# Patient Record
Sex: Female | Born: 1940 | Race: White | Hispanic: No | Smoking: Never smoker
Health system: Southern US, Community
[De-identification: ages and names within clinical notes are randomized; demographics above are authoritative.]

## PROBLEM LIST (undated history)

## (undated) DIAGNOSIS — I10 Essential (primary) hypertension: Secondary | ICD-10-CM

## (undated) DIAGNOSIS — E785 Hyperlipidemia, unspecified: Secondary | ICD-10-CM

## (undated) DIAGNOSIS — K219 Gastro-esophageal reflux disease without esophagitis: Secondary | ICD-10-CM

## (undated) HISTORY — PX: CHOLECYSTECTOMY: SHX55

## (undated) HISTORY — PX: KNEE ARTHROSCOPY: SHX127

## (undated) HISTORY — PX: APPENDECTOMY: SHX54

## (undated) HISTORY — PX: ABDOMINAL HYSTERECTOMY: SHX81

## (undated) HISTORY — PX: HERNIA REPAIR: SHX51

---

## 2006-06-06 ENCOUNTER — Ambulatory Visit: Payer: Self-pay | Admitting: Gastroenterology

## 2006-06-13 ENCOUNTER — Ambulatory Visit: Payer: Self-pay | Admitting: Gastroenterology

## 2007-01-22 ENCOUNTER — Emergency Department: Payer: Self-pay | Admitting: Emergency Medicine

## 2007-01-24 ENCOUNTER — Other Ambulatory Visit: Payer: Self-pay

## 2007-01-24 ENCOUNTER — Inpatient Hospital Stay: Payer: Self-pay | Admitting: General Surgery

## 2007-08-17 ENCOUNTER — Ambulatory Visit: Payer: Self-pay | Admitting: Internal Medicine

## 2007-12-02 ENCOUNTER — Ambulatory Visit: Payer: Self-pay | Admitting: Internal Medicine

## 2007-12-12 ENCOUNTER — Ambulatory Visit: Payer: Self-pay | Admitting: Internal Medicine

## 2008-01-02 ENCOUNTER — Ambulatory Visit: Payer: Self-pay | Admitting: Internal Medicine

## 2008-02-19 ENCOUNTER — Ambulatory Visit: Payer: Self-pay | Admitting: Internal Medicine

## 2008-03-03 ENCOUNTER — Ambulatory Visit: Payer: Self-pay | Admitting: Internal Medicine

## 2008-04-02 ENCOUNTER — Ambulatory Visit: Payer: Self-pay | Admitting: Internal Medicine

## 2008-04-15 ENCOUNTER — Ambulatory Visit: Payer: Self-pay | Admitting: Internal Medicine

## 2008-05-03 ENCOUNTER — Ambulatory Visit: Payer: Self-pay | Admitting: Internal Medicine

## 2008-06-03 ENCOUNTER — Ambulatory Visit: Payer: Self-pay | Admitting: Internal Medicine

## 2008-06-10 ENCOUNTER — Ambulatory Visit: Payer: Self-pay | Admitting: Internal Medicine

## 2008-07-01 ENCOUNTER — Ambulatory Visit: Payer: Self-pay | Admitting: Internal Medicine

## 2008-08-31 ENCOUNTER — Ambulatory Visit: Payer: Self-pay | Admitting: Internal Medicine

## 2008-09-27 ENCOUNTER — Ambulatory Visit: Payer: Self-pay | Admitting: Internal Medicine

## 2008-10-01 ENCOUNTER — Ambulatory Visit: Payer: Self-pay | Admitting: Internal Medicine

## 2009-01-20 ENCOUNTER — Ambulatory Visit: Payer: Self-pay | Admitting: Internal Medicine

## 2009-01-31 ENCOUNTER — Ambulatory Visit: Payer: Self-pay | Admitting: Internal Medicine

## 2009-04-27 ENCOUNTER — Emergency Department: Payer: Self-pay | Admitting: Emergency Medicine

## 2009-06-03 ENCOUNTER — Ambulatory Visit: Payer: Self-pay | Admitting: Internal Medicine

## 2009-06-09 ENCOUNTER — Ambulatory Visit: Payer: Self-pay | Admitting: Internal Medicine

## 2009-07-01 ENCOUNTER — Ambulatory Visit: Payer: Self-pay | Admitting: Internal Medicine

## 2010-06-05 ENCOUNTER — Ambulatory Visit: Payer: Self-pay | Admitting: Internal Medicine

## 2010-07-02 ENCOUNTER — Ambulatory Visit: Payer: Self-pay | Admitting: Internal Medicine

## 2010-11-23 ENCOUNTER — Ambulatory Visit: Payer: Self-pay | Admitting: Unknown Physician Specialty

## 2011-08-16 ENCOUNTER — Ambulatory Visit: Payer: Self-pay | Admitting: Gastroenterology

## 2011-08-17 LAB — PATHOLOGY REPORT

## 2012-08-12 ENCOUNTER — Emergency Department: Payer: Self-pay | Admitting: Internal Medicine

## 2013-01-02 ENCOUNTER — Ambulatory Visit: Payer: Self-pay

## 2013-04-04 ENCOUNTER — Ambulatory Visit: Payer: Self-pay | Admitting: General Practice

## 2013-04-23 ENCOUNTER — Ambulatory Visit: Payer: Self-pay | Admitting: General Practice

## 2013-04-23 DIAGNOSIS — I10 Essential (primary) hypertension: Secondary | ICD-10-CM

## 2013-04-23 LAB — CBC
MCH: 29.9 pg (ref 26.0–34.0)
MCHC: 33.1 g/dL (ref 32.0–36.0)
MCV: 91 fL (ref 80–100)
Platelet: 145 10*3/uL — ABNORMAL LOW (ref 150–440)
RDW: 13.4 % (ref 11.5–14.5)
WBC: 4.8 10*3/uL (ref 3.6–11.0)

## 2013-04-23 LAB — POTASSIUM: Potassium: 3.6 mmol/L (ref 3.5–5.1)

## 2013-05-02 ENCOUNTER — Ambulatory Visit: Payer: Self-pay | Admitting: General Practice

## 2014-08-24 NOTE — Op Note (Signed)
PATIENT NAME:  Rachael Knapp, Rachael Knapp MR#:  [id number] DATE OF BIRTH:  [birth year]  DATE OF PROCEDURE:  05/02/2013  PREOPERATIVE DIAGNOSIS: Internal derangement of the right knee.   POSTOPERATIVE DIAGNOSES:  1. Tear of the posterior horn of the medial meniscus, right knee.  2. 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  3. 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   PROCEDURES PERFORMED: Right knee arthroscopy, partial medial meniscectomy and chondroplasty of the medial and patellofemoral articulation.   SURGEON: Laurice Record. Holley Bouche., M.D.   ANESTHESIA: General.   ESTIMATED BLOOD LOSS: Minimal.   TOURNIQUET TIME: Not used.   DRAINS: None.   INDICATIONS FOR SURGERY: The patient is a 74 year old female who has been seen for complaints of swelling and right knee pain. MRI demonstrated findings consistent with meniscal pathology. After discussion of the risks and benefits of surgical intervention, the patient expressed understanding of the risks and benefits and agreed with plans for surgical intervention.   PROCEDURE IN DETAIL: The patient was brought into the operating room and, after adequate general anesthesia was achieved, a tourniquet was placed on the patient's right thigh and leg was placed in a leg holder. All bony prominences were well padded. The patient's right knee and leg were cleaned and prepped with alcohol and DuraPrep and draped in the usual sterile fashion. A "timeout" was performed as per usual protocol. The anticipated portal sites were injected with 0.25% Marcaine with epinephrine. An anterolateral portal was created and a cannula was inserted. A large effusion was evacuated. The scope was inserted, and the knee was distended with fluid using the Stryker pump. The scope was advanced down the medial gutter and into the medial compartment of the knee. Under visualization with the scope, an anteromedial portal was created, and a  hook probe was inserted. Inspection of the medial compartment demonstrated an area of grade 3 chondromalacia on the more posterior aspect of the medial femoral condyle. This area was debrided and contoured using the ArthroCare wand. Inspection of the meniscus demonstrated a degenerative tear of the posterior horn of the medial meniscus. This was debrided using meniscal punches and a 4.5 mm shaver. Final contouring was performed using the ArthroCare wand. The anterior horn was visualized and probed and felt to be stable. The scope was then advanced into the intracondylar region. The anterior cruciate ligament was visualized and probed and felt to be stable. The scope was removed from the anterolateral portal and reinserted via the anteromedial portal so as to better visualize the lateral compartment. The articular surface was in good condition. Some mild fraying of the inner margin of the meniscus was noted, and this was debrided using the ArthroCare wand. The remaining portion of the meniscus was intact. Finally, the scope was positioned so as to visualize the patellofemoral articulation. Good patellar tracking was noted. Grade 3 to early grade 4 changes of chondromalacia were noted to the lateral facet. Some contouring was performed.   The knee was irrigated with copious amounts of fluid and then suctioned dry. The anterolateral portal was reapproximated using #3-0 nylon. A combination of 0.25% Marcaine with epinephrine and 4 mg of morphine was injected via the scope. The scope was removed, and the anteromedial portal was reapproximated using #3-0 nylon. Sterile dressing was applied, followed by application of an ice wrap.   The patient tolerated the procedure well. She was transported to the recovery room in stable condition.   ____________________________ Laurice Record. Hooten Jr.,  MD jph:gb D: 05/03/2013 01:28:10 ET T: 05/03/2013 01:52:27 ET JOB#: [id number]  cc: Jeneen Rinks P. Holley Bouche., MD, <Dictator> Laurice Record  Holley Bouche MD ELECTRONICALLY SIGNED 05/06/2013 15:58

## 2014-12-24 ENCOUNTER — Emergency Department: Payer: PPO

## 2014-12-24 ENCOUNTER — Emergency Department
Admission: EM | Admit: 2014-12-24 | Discharge: 2014-12-24 | Disposition: A | Discharge status: Home / Self Care | Payer: PPO | Attending: Emergency Medicine | Admitting: Emergency Medicine

## 2014-12-24 ENCOUNTER — Encounter: Payer: Self-pay | Admitting: *Deleted

## 2014-12-24 DIAGNOSIS — W19XXXA Unspecified fall, initial encounter: Secondary | ICD-10-CM

## 2014-12-24 DIAGNOSIS — S0031XA Abrasion of nose, initial encounter: Secondary | ICD-10-CM | POA: Diagnosis not present

## 2014-12-24 DIAGNOSIS — Y9389 Activity, other specified: Secondary | ICD-10-CM | POA: Diagnosis not present

## 2014-12-24 DIAGNOSIS — Y9289 Other specified places as the place of occurrence of the external cause: Secondary | ICD-10-CM | POA: Insufficient documentation

## 2014-12-24 DIAGNOSIS — I10 Essential (primary) hypertension: Secondary | ICD-10-CM | POA: Insufficient documentation

## 2014-12-24 DIAGNOSIS — W01198A Fall on same level from slipping, tripping and stumbling with subsequent striking against other object, initial encounter: Secondary | ICD-10-CM | POA: Insufficient documentation

## 2014-12-24 DIAGNOSIS — Y998 Other external cause status: Secondary | ICD-10-CM | POA: Diagnosis not present

## 2014-12-24 DIAGNOSIS — T148XXA Other injury of unspecified body region, initial encounter: Secondary | ICD-10-CM

## 2014-12-24 DIAGNOSIS — Z880 Allergy status to penicillin: Secondary | ICD-10-CM | POA: Insufficient documentation

## 2014-12-24 DIAGNOSIS — S0992XA Unspecified injury of nose, initial encounter: Secondary | ICD-10-CM | POA: Diagnosis present

## 2014-12-24 HISTORY — DX: Gastro-esophageal reflux disease without esophagitis: K21.9

## 2014-12-24 HISTORY — DX: Essential (primary) hypertension: I10

## 2014-12-24 HISTORY — DX: Hyperlipidemia, unspecified: E78.5

## 2014-12-24 NOTE — ED Notes (Signed)
States she hit her toe and fell face first..

## 2014-12-24 NOTE — ED Provider Notes (Signed)
[facility] Emergency Department Provider Note   ____________________________________________  Time seen: 1455  I have reviewed the triage vital signs and the nursing notes.   HISTORY  Chief Complaint Fall   History limited by: Not Limited   HPI Rachael Knapp is a 74 y.o. female who presents to the emergency department today after a fall. The patient states she was walking up a step when she caught her big toe. Hit the front of her face. She denies any LOC. States she feels like she jarred her brain. She denies any neck pain. She denies any chest pain, shortness of breath, recent fevers.    Past Medical History  Diagnosis Date  . Hypertension   . Hyperlipemia   . GERD (gastroesophageal reflux disease)     There are no active problems to display for this patient.   Past Surgical History  Procedure Laterality Date  . Abdominal hysterectomy      No current outpatient prescriptions on file.  Allergies Amoxicillin and Clindamycin/lincomycin  History reviewed. No pertinent family history.  Social History Social History  Substance Use Topics  . Smoking status: Never Smoker   . Smokeless tobacco: None  . Alcohol Use: None    Review of Systems  Constitutional: Negative for fever. Cardiovascular: Negative for chest pain. Respiratory: Negative for shortness of breath. Gastrointestinal: Negative for abdominal pain, vomiting and diarrhea. Genitourinary: Negative for dysuria. Musculoskeletal: Negative for back pain. Skin: Abrasion to the nose Neurological: Negative for headaches, focal weakness or numbness.  10-point ROS otherwise negative.  ____________________________________________   PHYSICAL EXAM:  VITAL SIGNS: ED Triage Vitals  Enc Vitals Group     BP 12/24/14 1430 165/67 mmHg     Pulse Rate 12/24/14 1430 74     Resp 12/24/14 1430 18     Temp 12/24/14 1430 98.5 F (36.9 C)     Temp Source 12/24/14 1430 Oral     SpO2  12/24/14 1430 99 %     Weight 12/24/14 1430 160 lb (72.576 kg)     Height 12/24/14 1430 5\' 7"  (1.702 m)   Constitutional: Alert and oriented. Well appearing and in no distress. Eyes: Conjunctivae are normal. PERRL. Normal extraocular movements. ENT   Head: Normocephalic. Abrasion over nose. No hematympanum   Nose: No congestion/rhinnorhea. No septal hematoma.   Mouth/Throat: Mucous membranes are moist.   Neck: No stridor. No midline tenderness.  Hematological/Lymphatic/Immunilogical: No cervical lymphadenopathy. Cardiovascular: Normal rate, regular rhythm.  No murmurs, rubs, or gallops. Respiratory: Normal respiratory effort without tachypnea nor retractions. Breath sounds are clear and equal bilaterally. No wheezes/rales/rhonchi. Gastrointestinal: Soft and nontender. No distention.  Genitourinary: Deferred Musculoskeletal: Normal range of motion in all extremities. No joint effusions.  No lower extremity tenderness nor edema. Pelvis stable. Neurologic:  Normal speech and language. No gross focal neurologic deficits are appreciated. Speech is normal.  Skin:  Skin is warm, dry and intact. No rash noted. Psychiatric: Mood and affect are normal. Speech and behavior are normal. Patient exhibits appropriate insight and judgment.  ____________________________________________    LABS (pertinent positives/negatives)  None  ____________________________________________   EKG  None  ____________________________________________    RADIOLOGY  CT head  IMPRESSION: No acute intracranial abnormality.  Small calcified meningioma.  ____________________________________________   PROCEDURES  Procedure(s) performed: None  Critical Care performed: No  ____________________________________________   INITIAL IMPRESSION / ASSESSMENT AND PLAN / ED COURSE  Pertinent labs & imaging results that were available during my care of the patient were reviewed  by me and considered  in my medical decision making (see chart for details).  Patient presented to the emergency department because of concerns for facial trauma after a fall. Patient had a mechanical fall. Did suffer some abrasions to her nose. No septal hematoma noted. Patient's head CT was negative for any cranial bleed. Neck without any midline tenderness, nontender to full range of motion.  ____________________________________________   FINAL CLINICAL IMPRESSION(S) / ED DIAGNOSES  Final diagnoses:  Fall, initial encounter  Abrasion     Nance Pear, MD 12/24/14 [phone]

## 2014-12-24 NOTE — Discharge Instructions (Signed)
Please seek medical attention for any high fevers, chest pain, shortness of breath, change in behavior, persistent vomiting, bloody stool or any other new or concerning symptoms.   Abrasion An abrasion is a cut or scrape of the skin. Abrasions do not extend through all layers of the skin and most heal within 10 days. It is important to care for your abrasion properly to prevent infection. CAUSES  Most abrasions are caused by falling on, or gliding across, the ground or other surface. When your skin rubs on something, the outer and inner layer of skin rubs off, causing an abrasion. DIAGNOSIS  Your caregiver will be able to diagnose an abrasion during a physical exam.  TREATMENT  Your treatment depends on how large and deep the abrasion is. Generally, your abrasion will be cleaned with water and a mild soap to remove any dirt or debris. An antibiotic ointment may be put over the abrasion to prevent an infection. A bandage (dressing) may be wrapped around the abrasion to keep it from getting dirty.  You may need a tetanus shot if:  You cannot remember when you had your last tetanus shot.  You have never had a tetanus shot.  The injury broke your skin. If you get a tetanus shot, your arm may swell, get red, and feel warm to the touch. This is common and not a problem. If you need a tetanus shot and you choose not to have one, there is a rare chance of getting tetanus. Sickness from tetanus can be serious.  HOME CARE INSTRUCTIONS   If a dressing was applied, change it at least once a day or as directed by your caregiver. If the bandage sticks, soak it off with warm water.   Wash the area with water and a mild soap to remove all the ointment 2 times a day. Rinse off the soap and pat the area dry with a clean towel.   Reapply any ointment as directed by your caregiver. This will help prevent infection and keep the bandage from sticking. Use gauze over the wound and under the dressing to help keep  the bandage from sticking.   Change your dressing right away if it becomes wet or dirty.   Only take over-the-counter or prescription medicines for pain, discomfort, or fever as directed by your caregiver.   Follow up with your caregiver within 24-48 hours for a wound check, or as directed. If you were not given a wound-check appointment, look closely at your abrasion for redness, swelling, or pus. These are signs of infection. SEEK IMMEDIATE MEDICAL CARE IF:   You have increasing pain in the wound.   You have redness, swelling, or tenderness around the wound.   You have pus coming from the wound.   You have a fever or persistent symptoms for more than 2-3 days.  You have a fever and your symptoms suddenly get worse.  You have a bad smell coming from the wound or dressing.  MAKE SURE YOU:   Understand these instructions.  Will watch your condition.  Will get help right away if you are not doing well or get worse. Document Released: 01/27/2005 Document Revised: 04/05/2012 Document Reviewed: 03/23/2011 [facility] Patient Information 2015 [place], [place]. This information is not intended to replace advice given to you by your health care provider. Make sure you discuss any questions you have with your health care provider.

## 2015-03-19 ENCOUNTER — Other Ambulatory Visit: Payer: Self-pay | Admitting: Nurse Practitioner

## 2015-03-19 DIAGNOSIS — E2839 Other primary ovarian failure: Secondary | ICD-10-CM

## 2015-04-02 ENCOUNTER — Ambulatory Visit: Payer: PPO

## 2015-04-09 ENCOUNTER — Ambulatory Visit
Admission: RE | Admit: 2015-04-09 | Discharge: 2015-04-09 | Disposition: A | Discharge status: Home / Self Care | Payer: PPO | Source: Ambulatory Visit | Attending: Nurse Practitioner | Admitting: Nurse Practitioner

## 2015-04-09 DIAGNOSIS — E2839 Other primary ovarian failure: Secondary | ICD-10-CM | POA: Insufficient documentation

## 2015-06-18 DIAGNOSIS — E782 Mixed hyperlipidemia: Secondary | ICD-10-CM | POA: Diagnosis not present

## 2015-06-18 DIAGNOSIS — I10 Essential (primary) hypertension: Secondary | ICD-10-CM | POA: Diagnosis not present

## 2015-06-18 DIAGNOSIS — K219 Gastro-esophageal reflux disease without esophagitis: Secondary | ICD-10-CM | POA: Diagnosis not present

## 2016-01-13 ENCOUNTER — Other Ambulatory Visit: Payer: Self-pay | Admitting: Nurse Practitioner

## 2016-01-13 DIAGNOSIS — I10 Essential (primary) hypertension: Secondary | ICD-10-CM | POA: Diagnosis not present

## 2016-01-13 DIAGNOSIS — Z1231 Encounter for screening mammogram for malignant neoplasm of breast: Secondary | ICD-10-CM

## 2016-01-13 DIAGNOSIS — E782 Mixed hyperlipidemia: Secondary | ICD-10-CM | POA: Diagnosis not present

## 2016-01-13 DIAGNOSIS — K219 Gastro-esophageal reflux disease without esophagitis: Secondary | ICD-10-CM | POA: Diagnosis not present

## 2016-01-13 DIAGNOSIS — Z0001 Encounter for general adult medical examination with abnormal findings: Secondary | ICD-10-CM | POA: Diagnosis not present

## 2016-01-16 DIAGNOSIS — E782 Mixed hyperlipidemia: Secondary | ICD-10-CM | POA: Diagnosis not present

## 2016-01-16 DIAGNOSIS — E559 Vitamin D deficiency, unspecified: Secondary | ICD-10-CM | POA: Diagnosis not present

## 2016-01-16 DIAGNOSIS — Z0001 Encounter for general adult medical examination with abnormal findings: Secondary | ICD-10-CM | POA: Diagnosis not present

## 2016-01-16 DIAGNOSIS — I10 Essential (primary) hypertension: Secondary | ICD-10-CM | POA: Diagnosis not present

## 2016-01-28 ENCOUNTER — Emergency Department
Admission: EM | Admit: 2016-01-28 | Discharge: 2016-01-29 | Disposition: A | Discharge status: Home / Self Care | Payer: PPO | Attending: Emergency Medicine | Admitting: Emergency Medicine

## 2016-01-28 ENCOUNTER — Encounter: Payer: Self-pay | Admitting: Emergency Medicine

## 2016-01-28 DIAGNOSIS — I10 Essential (primary) hypertension: Secondary | ICD-10-CM | POA: Insufficient documentation

## 2016-01-28 DIAGNOSIS — Y9389 Activity, other specified: Secondary | ICD-10-CM | POA: Insufficient documentation

## 2016-01-28 DIAGNOSIS — T63441A Toxic effect of venom of bees, accidental (unintentional), initial encounter: Secondary | ICD-10-CM | POA: Diagnosis not present

## 2016-01-28 DIAGNOSIS — T63481A Toxic effect of venom of other arthropod, accidental (unintentional), initial encounter: Secondary | ICD-10-CM | POA: Diagnosis not present

## 2016-01-28 DIAGNOSIS — L03113 Cellulitis of right upper limb: Secondary | ICD-10-CM | POA: Insufficient documentation

## 2016-01-28 DIAGNOSIS — X58XXXA Exposure to other specified factors, initial encounter: Secondary | ICD-10-CM | POA: Diagnosis not present

## 2016-01-28 DIAGNOSIS — Y92007 Garden or yard of unspecified non-institutional (private) residence as the place of occurrence of the external cause: Secondary | ICD-10-CM | POA: Diagnosis not present

## 2016-01-28 DIAGNOSIS — Y999 Unspecified external cause status: Secondary | ICD-10-CM | POA: Insufficient documentation

## 2016-01-28 DIAGNOSIS — S50861A Insect bite (nonvenomous) of right forearm, initial encounter: Secondary | ICD-10-CM | POA: Diagnosis not present

## 2016-01-28 DIAGNOSIS — T7840XA Allergy, unspecified, initial encounter: Secondary | ICD-10-CM | POA: Diagnosis not present

## 2016-01-28 LAB — CBC WITH DIFFERENTIAL/PLATELET
Basophils Absolute: 0 10*3/uL (ref 0–0.1)
Basophils Relative: 1 %
EOS ABS: 0.1 10*3/uL (ref 0–0.7)
Eosinophils Relative: 2 %
HEMATOCRIT: 40.5 % (ref 35.0–47.0)
HEMOGLOBIN: 14 g/dL (ref 12.0–16.0)
LYMPHS ABS: 2 10*3/uL (ref 1.0–3.6)
LYMPHS PCT: 26 %
MCH: 30.9 pg (ref 26.0–34.0)
MCHC: 34.5 g/dL (ref 32.0–36.0)
MCV: 89.5 fL (ref 80.0–100.0)
Monocytes Absolute: 0.6 10*3/uL (ref 0.2–0.9)
Monocytes Relative: 7 %
NEUTROS ABS: 4.9 10*3/uL (ref 1.4–6.5)
NEUTROS PCT: 64 %
Platelets: 122 10*3/uL — ABNORMAL LOW (ref 150–440)
RBC: 4.53 MIL/uL (ref 3.80–5.20)
RDW: 13.3 % (ref 11.5–14.5)
WBC: 7.6 10*3/uL (ref 3.6–11.0)

## 2016-01-28 MED ORDER — SULFAMETHOXAZOLE-TRIMETHOPRIM 800-160 MG PO TABS
1.0000 | ORAL_TABLET | Freq: Once | ORAL | Status: AC
  Administered 2016-01-28: 1 via ORAL
  Filled 2016-01-28: qty 1

## 2016-01-28 MED ORDER — FAMOTIDINE 20 MG PO TABS
40.0000 mg | ORAL_TABLET | Freq: Once | ORAL | Status: AC
  Administered 2016-01-28: 40 mg via ORAL
  Filled 2016-01-28: qty 2

## 2016-01-28 MED ORDER — DIPHENHYDRAMINE HCL 50 MG/ML IJ SOLN
25.0000 mg | Freq: Once | INTRAMUSCULAR | Status: AC
  Administered 2016-01-28: 25 mg via INTRAMUSCULAR
  Filled 2016-01-28: qty 1

## 2016-01-28 NOTE — ED Triage Notes (Signed)
Pt ambulatory to triage in NAD, reports yesterday thought she had bug bite to right forearm, but swelling has increased. Large red, swollen area noted 19x13cm.  Pt reports some intermittent itching.  Pt tx at home with allegra cream and ice at home w/o relief.

## 2016-01-28 NOTE — ED Notes (Signed)
Pt has insect bite to right forearm with redness and swelling that began yesterday and got worse tonight.  No itching now.  Pt took allegra with some relief.  Pt also using icepack for swelling.

## 2016-01-28 NOTE — ED Notes (Signed)
Pt was working on the garden yesterday when felt like something pricked her right forearm. Today has large swollen, red area to forearm, unsure of insect bite.

## 2016-01-29 LAB — BASIC METABOLIC PANEL
ANION GAP: 7 (ref 5–15)
BUN: 14 mg/dL (ref 6–20)
CHLORIDE: 103 mmol/L (ref 101–111)
CO2: 27 mmol/L (ref 22–32)
Calcium: 9.2 mg/dL (ref 8.9–10.3)
Creatinine, Ser: 1.11 mg/dL — ABNORMAL HIGH (ref 0.44–1.00)
GFR calc Af Amer: 55 mL/min — ABNORMAL LOW (ref >60)
GFR, EST NON AFRICAN AMERICAN: 47 mL/min — AB (ref >60)
Glucose, Bld: 138 mg/dL — ABNORMAL HIGH (ref 65–99)
POTASSIUM: 3.7 mmol/L (ref 3.5–5.1)
SODIUM: 137 mmol/L (ref 135–145)

## 2016-01-29 MED ORDER — RANITIDINE HCL 150 MG PO TABS: 150.0000 mg | ORAL_TABLET | Freq: Two times a day (BID) | ORAL | 0 refills | Status: DC

## 2016-01-29 MED ORDER — SULFAMETHOXAZOLE-TRIMETHOPRIM 800-160 MG PO TABS: 1.0000 | ORAL_TABLET | Freq: Two times a day (BID) | ORAL | 0 refills | Status: DC

## 2016-01-29 NOTE — Discharge Instructions (Signed)
Your labs are normal. You are being treated for a likely allergic reaction to an insect bite. You are also being covered for any potential skin infection with an antibiotic. Take the antibiotic as directed for skin infection. Take OTC Benadryl as directed for itch relief. Follow-up with Dr. Humphrey Rolls for recheck of your arm cellulitis in 2-3 days or return to the ED if needed for spreading or redness, swelling, or fevers.

## 2016-01-29 NOTE — ED Provider Notes (Signed)
[facility] Emergency Department Provider Note ____________________________________________  Time seen: 2327  I have reviewed the triage vital signs and the nursing notes.  HISTORY  Chief Complaint  Arm Swelling  HPI Rachael Knapp is a 75 y.o. female presents to the ED accompanied by her husband for evaluation of presumed allergic reaction to the right forearm. Patient describes yesterday she was working with her husband in the yard cutting grass from the edge of the fence. She describes at some point looking at her forearm tenderness a single needle prick type lesion to the volar forearm. She had a single spot of blood that was not initially with any bite, standing, or prick from a thorn. She had mild swelling and itching to the area surrounding the puncture yesterday while at work. She presents today with significant increase to the area of redness, swelling, and warmth. She reports only mild, intermittent itching in the time. She has applied and topical antihistamine cream and ice compresses to the area without significant relief. She is not taking any antihistamines in the interim. She denies any interim fevers, chills, sweats. She has also been no spontaneous drainage from the area. She denies any known contact, Allergan, or irritant exposure.  Past Medical History:  Diagnosis Date  . GERD (gastroesophageal reflux disease)   . Hyperlipemia   . Hypertension     There are no active problems to display for this patient.   Past Surgical History:  Procedure Laterality Date  . ABDOMINAL HYSTERECTOMY      Prior to Admission medications   Medication Sig Start Date End Date Taking? Authorizing Provider  ranitidine (ZANTAC) 150 MG tablet Take 1 tablet (150 mg total) by mouth 2 (two) times daily. 01/29/16   Vashti Bolanos V Bacon Darcey Cardy, PA-C  sulfamethoxazole-trimethoprim (BACTRIM DS,SEPTRA DS) 800-160 MG tablet Take 1 tablet by mouth 2 (two) times daily. 01/29/16   Jakye Mullens  V Bacon Karelyn Brisby, PA-C    Allergies Amoxicillin; Ranitidine; and Clindamycin/lincomycin  History reviewed. No pertinent family history.  Social History Social History  Substance Use Topics  . Smoking status: Never Smoker  . Smokeless tobacco: Never Used  . Alcohol use No   Review of Systems  Constitutional: Negative for fever. Eyes: Negative for visual changes. ENT: Negative for sore throat. Cardiovascular: Negative for chest pain. Respiratory: Negative for shortness of breath. Gastrointestinal: Negative for abdominal pain, vomiting and diarrhea. Skin: Positive for rash. Neurological: Negative for headaches, focal weakness or numbness. ____________________________________________  PHYSICAL EXAM:  VITAL SIGNS: ED Triage Vitals [01/28/16 2200]  Enc Vitals Group     BP (!) 182/71     Pulse Rate 72     Resp 16     Temp 98.5 F (36.9 C)     Temp Source Oral     SpO2 100 %     Weight 160 lb (72.6 kg)     Height 5\' 5"  (1.651 m)     Head Circumference      Peak Flow      Pain Score 0     Pain Loc      Pain Edu?      Excl. in [place]?     Constitutional: Alert and oriented. Well appearing and in no distress. Head: Normocephalic and atraumatic. Cardiovascular: Normal rate, regular rhythm. Normal distal pulses and capillary refill. Respiratory: Normal respiratory effort. No wheezes/rales/rhonchi. Musculoskeletal: Nontender with normal range of motion in all extremities.  Neurologic:  Normal gait without ataxia. Normal speech and language. No gross  focal neurologic deficits are appreciated. Skin:  Skin is warm, dry and intact. Patient's volar right forearm from the wrist to the proximal elbow noted to have a erythematous, indurated, warm macular lesion that is easily blanchable. The area measures 19 x 13 cm that is large point. There appears to be a distal lesion with local papillae which may represent the initial insect bite. There is no pointing lesion, punctum, or fluctuance  appreciated. Psychiatric: Mood and affect are normal. Patient exhibits appropriate insight and judgment. ____________________________________________    LABS (pertinent positives/negatives) Labs Reviewed  CBC WITH DIFFERENTIAL/PLATELET - Abnormal; Notable for the following:       Result Value   Platelets 122 (*)    All other components within normal limits  BASIC METABOLIC PANEL - Abnormal; Notable for the following:    Glucose, Bld 138 (*)    Creatinine, Ser 1.11 (*)    GFR calc non Af Amer 47 (*)    GFR calc Af Amer 55 (*)    All other components within normal limits   ___________________________________________  PROCEDURES  Benadryl 25 mg IM Famotidine 40 mg PO Bactrim DS 1 PO ____________________________________________  INITIAL IMPRESSION / ASSESSMENT AND PLAN / ED COURSE  Patient with a likely local allergic reaction to insect bite/sting with a possible superficial cellulitis. She is discharged following the initiation of the. Treatment in the ED. She'll be discharged with a prescription for Bactrim DS as well has instructions to dose over-the-counter Benadryl for antihistamine blockade. Her skin is marked at the border of her erythematous lesion. She will apply cool compresses and follow with her primary care provider for interim evaluation. Return precautions were reviewed.  Clinical Course   ____________________________________________  FINAL CLINICAL IMPRESSION(S) / ED DIAGNOSES  Final diagnoses:  Allergic reaction to insect sting, accidental or unintentional, initial encounter  Cellulitis of right upper extremity      Melvenia Needles, PA-C 01/29/16 0043    Harvest Dark, MD 01/29/16 2259

## 2016-02-02 ENCOUNTER — Ambulatory Visit
Admission: RE | Admit: 2016-02-02 | Discharge: 2016-02-02 | Disposition: A | Discharge status: Home / Self Care | Payer: PPO | Source: Ambulatory Visit | Attending: Nurse Practitioner | Admitting: Nurse Practitioner

## 2016-02-02 DIAGNOSIS — Z1231 Encounter for screening mammogram for malignant neoplasm of breast: Secondary | ICD-10-CM | POA: Diagnosis not present

## 2016-02-02 MED ORDER — DIAZEPAM 5 MG PO TABS
ORAL_TABLET | ORAL | Status: AC
  Filled 2016-02-02: qty 1

## 2016-02-03 DIAGNOSIS — D696 Thrombocytopenia, unspecified: Secondary | ICD-10-CM | POA: Diagnosis not present

## 2016-02-03 DIAGNOSIS — R944 Abnormal results of kidney function studies: Secondary | ICD-10-CM | POA: Diagnosis not present

## 2016-02-03 DIAGNOSIS — L03113 Cellulitis of right upper limb: Secondary | ICD-10-CM | POA: Diagnosis not present

## 2016-02-12 DIAGNOSIS — L03113 Cellulitis of right upper limb: Secondary | ICD-10-CM | POA: Diagnosis not present

## 2016-02-12 DIAGNOSIS — R944 Abnormal results of kidney function studies: Secondary | ICD-10-CM | POA: Diagnosis not present

## 2016-02-12 DIAGNOSIS — D696 Thrombocytopenia, unspecified: Secondary | ICD-10-CM | POA: Diagnosis not present

## 2016-02-17 ENCOUNTER — Other Ambulatory Visit: Payer: Self-pay | Admitting: *Deleted

## 2016-02-17 ENCOUNTER — Inpatient Hospital Stay
Admission: RE | Admit: 2016-02-17 | Discharge: 2016-02-17 | Disposition: A | Discharge status: Home / Self Care | Payer: Self-pay | Source: Ambulatory Visit | Attending: *Deleted | Admitting: *Deleted

## 2016-02-17 DIAGNOSIS — Z9289 Personal history of other medical treatment: Secondary | ICD-10-CM

## 2016-07-19 DIAGNOSIS — I10 Essential (primary) hypertension: Secondary | ICD-10-CM | POA: Diagnosis not present

## 2016-07-19 DIAGNOSIS — E782 Mixed hyperlipidemia: Secondary | ICD-10-CM | POA: Diagnosis not present

## 2016-07-19 DIAGNOSIS — K219 Gastro-esophageal reflux disease without esophagitis: Secondary | ICD-10-CM | POA: Diagnosis not present

## 2017-01-17 DIAGNOSIS — K219 Gastro-esophageal reflux disease without esophagitis: Secondary | ICD-10-CM | POA: Diagnosis not present

## 2017-01-17 DIAGNOSIS — Z0001 Encounter for general adult medical examination with abnormal findings: Secondary | ICD-10-CM | POA: Diagnosis not present

## 2017-01-17 DIAGNOSIS — R21 Rash and other nonspecific skin eruption: Secondary | ICD-10-CM | POA: Diagnosis not present

## 2017-01-17 DIAGNOSIS — E782 Mixed hyperlipidemia: Secondary | ICD-10-CM | POA: Diagnosis not present

## 2017-01-17 DIAGNOSIS — I10 Essential (primary) hypertension: Secondary | ICD-10-CM | POA: Diagnosis not present

## 2017-01-21 ENCOUNTER — Other Ambulatory Visit: Payer: Self-pay | Admitting: Nurse Practitioner

## 2017-01-21 DIAGNOSIS — Z1231 Encounter for screening mammogram for malignant neoplasm of breast: Secondary | ICD-10-CM

## 2017-01-25 DIAGNOSIS — I10 Essential (primary) hypertension: Secondary | ICD-10-CM | POA: Diagnosis not present

## 2017-01-25 DIAGNOSIS — Z0001 Encounter for general adult medical examination with abnormal findings: Secondary | ICD-10-CM | POA: Diagnosis not present

## 2017-01-25 DIAGNOSIS — D696 Thrombocytopenia, unspecified: Secondary | ICD-10-CM | POA: Diagnosis not present

## 2017-02-09 ENCOUNTER — Ambulatory Visit
Admission: RE | Admit: 2017-02-09 | Discharge: 2017-02-09 | Disposition: A | Discharge status: Home / Self Care | Payer: PPO | Source: Ambulatory Visit | Attending: Nurse Practitioner | Admitting: Nurse Practitioner

## 2017-02-09 DIAGNOSIS — Z1231 Encounter for screening mammogram for malignant neoplasm of breast: Secondary | ICD-10-CM | POA: Diagnosis not present

## 2017-05-16 ENCOUNTER — Other Ambulatory Visit: Payer: Self-pay

## 2017-05-16 MED ORDER — LOSARTAN POTASSIUM-HCTZ 100-12.5 MG PO TABS: 1.0000 | ORAL_TABLET | Freq: Every day | ORAL | 0 refills | Status: DC

## 2017-06-14 DIAGNOSIS — Z862 Personal history of diseases of the blood and blood-forming organs and certain disorders involving the immune mechanism: Secondary | ICD-10-CM | POA: Diagnosis not present

## 2017-06-14 DIAGNOSIS — Z8371 Family history of colonic polyps: Secondary | ICD-10-CM | POA: Diagnosis not present

## 2017-06-23 DIAGNOSIS — M25511 Pain in right shoulder: Secondary | ICD-10-CM | POA: Diagnosis not present

## 2017-07-14 ENCOUNTER — Encounter: Payer: Self-pay | Admitting: Nurse Practitioner

## 2017-07-14 ENCOUNTER — Ambulatory Visit (INDEPENDENT_AMBULATORY_CARE_PROVIDER_SITE_OTHER): Payer: PPO | Admitting: Nurse Practitioner

## 2017-07-14 VITALS — BP 160/90 | HR 97 | Resp 16 | Ht 66.0 in | Wt 150.0 lb

## 2017-07-14 DIAGNOSIS — E785 Hyperlipidemia, unspecified: Secondary | ICD-10-CM | POA: Insufficient documentation

## 2017-07-14 DIAGNOSIS — E782 Mixed hyperlipidemia: Secondary | ICD-10-CM | POA: Diagnosis not present

## 2017-07-14 DIAGNOSIS — I10 Essential (primary) hypertension: Secondary | ICD-10-CM | POA: Diagnosis not present

## 2017-07-14 DIAGNOSIS — K219 Gastro-esophageal reflux disease without esophagitis: Secondary | ICD-10-CM | POA: Insufficient documentation

## 2017-07-14 MED ORDER — LOSARTAN POTASSIUM-HCTZ 100-25 MG PO TABS: 1.0000 | ORAL_TABLET | Freq: Every day | ORAL | 1 refills | Status: DC

## 2017-07-14 NOTE — Progress Notes (Signed)
[facility] [place], [place] [address]  Internal MEDICINE  Office Visit Note  Patient Name: Rachael Knapp  [id number]  [id number]  Date of Service: 07/14/2017  Chief Complaint  Patient presents with  . Hypertension    Hypertension  This is a chronic problem. The current episode started more than 1 year ago. The problem is unchanged. The problem is controlled. Pertinent negatives include no chest pain, headaches, neck pain, palpitations or shortness of breath. There are no associated agents to hypertension. Risk factors for coronary artery disease include dyslipidemia, family history and post-menopausal state. Past treatments include angiotensin blockers and diuretics. The current treatment provides moderate improvement. There are no compliance problems.     Pt is here for routine follow up.    Current Medication: Outpatient Encounter Medications as of 07/14/2017  Medication Sig  . simvastatin (ZOCOR) 20 MG tablet Take by mouth.  . [DISCONTINUED] losartan-hydrochlorothiazide (HYZAAR) 100-12.5 MG tablet Take 1 tablet by mouth daily.  Marland Kitchen losartan-hydrochlorothiazide (HYZAAR) 100-25 MG tablet Take 1 tablet by mouth daily.  . [DISCONTINUED] ranitidine (ZANTAC) 150 MG tablet Take 1 tablet (150 mg total) by mouth 2 (two) times daily. (Patient not taking: Reported on 07/14/2017)  . [DISCONTINUED] sulfamethoxazole-trimethoprim (BACTRIM DS,SEPTRA DS) 800-160 MG tablet Take 1 tablet by mouth 2 (two) times daily.   No facility-administered encounter medications on file as of 07/14/2017.     Surgical History: Past Surgical History:  Procedure Laterality Date  . ABDOMINAL HYSTERECTOMY      Medical History: Past Medical History:  Diagnosis Date  . GERD (gastroesophageal reflux disease)   . Hyperlipemia   . Hypertension     Family History: Family History  Problem Relation Age of Onset  . Breast cancer Neg Hx     Social History    Socioeconomic History  . Marital status: Widowed    Spouse name: Not on file  . Number of children: Not on file  . Years of education: Not on file  . Highest education level: Not on file  Social Needs  . Financial resource strain: Not on file  . Food insecurity - worry: Not on file  . Food insecurity - inability: Not on file  . Transportation needs - medical: Not on file  . Transportation needs - non-medical: Not on file  Occupational History  . Not on file  Tobacco Use  . Smoking status: Never Smoker  . Smokeless tobacco: Never Used  Substance and Sexual Activity  . Alcohol use: No  . Drug use: Not on file  . Sexual activity: Not on file  Other Topics Concern  . Not on file  Social History Narrative  . Not on file      Review of Systems  Constitutional: Negative for activity change, chills, fatigue and unexpected weight change.  HENT: Negative for postnasal drip, rhinorrhea, sneezing and sore throat.   Eyes: Negative for redness.  Respiratory: Negative for cough, chest tightness and shortness of breath.   Cardiovascular: Negative for chest pain and palpitations.       Blood pressure elevated.   Gastrointestinal: Negative for abdominal pain, constipation, diarrhea, nausea and vomiting.  Endocrine: Negative for cold intolerance, heat intolerance, polydipsia, polyphagia and polyuria.  Genitourinary: Negative.  Negative for dysuria and frequency.  Musculoskeletal: Positive for arthralgias. Negative for back pain, joint swelling and neck pain.       Did have some right shoulder and arm pain for short time. Did see orthopedic provider. Was given  anti-inflammatory medication. She got full relief from the pain.   Skin: Negative for rash.  Allergic/Immunologic: Negative for environmental allergies.  Neurological: Negative for tremors, numbness and headaches.  Hematological: Negative for adenopathy. Does not bruise/bleed easily.  Psychiatric/Behavioral: Negative for behavioral  problems (Depression), sleep disturbance and suicidal ideas. The patient is not nervous/anxious.        The patient states that her husband passed away in Jan 17, 2023. Had very short illness. Diagnosed with lung cancer, which and metastasized to his brain. Did not tolerate chemotherapy.     Today's Vitals   07/14/17 1416  BP: (!) 160/90  Pulse: 97  Resp: 16  SpO2: 97%  Weight: 150 lb (68 kg)  Height: 5\' 6"  (1.676 m)    Physical Exam  Constitutional: She is oriented to person, place, and time. She appears well-developed and well-nourished. No distress.  HENT:  Head: Normocephalic and atraumatic.  Mouth/Throat: Oropharynx is clear and moist. No oropharyngeal exudate.  Eyes: EOM are normal. Pupils are equal, round, and reactive to light.  Neck: Normal range of motion. Neck supple. No JVD present. No tracheal deviation present. No thyromegaly present.  Cardiovascular: Normal rate, regular rhythm and normal heart sounds. Exam reveals no gallop and no friction rub.  No murmur heard. Pulmonary/Chest: Effort normal and breath sounds normal. No respiratory distress. She has no wheezes. She has no rales. She exhibits no tenderness.  Abdominal: Soft. Bowel sounds are normal. There is no tenderness.  Musculoskeletal: Normal range of motion.  Lymphadenopathy:    She has no cervical adenopathy.  Neurological: She is alert and oriented to person, place, and time. No cranial nerve deficit.  Skin: Skin is warm and dry. She is not diaphoretic.  Psychiatric: She has a normal mood and affect. Her behavior is normal. Judgment and thought content normal.  Nursing note and vitals reviewed.   Assessment/Plan: 1. Essential hypertension Increased losartan/HCYZ to 100/25mg  daily.  - losartan-hydrochlorothiazide (HYZAAR) 100-25 MG tablet; Take 1 tablet by mouth daily.  Dispense: 90 tablet; Refill: 1  2. Mixed hyperlipidemia Stable. Simvastatin as prescribed   3. Gastroesophageal reflux disease without  esophagitis Improved. Use pantoprazole as needed and as prescribed .  General Counseling: Rachael Knapp understanding of the findings of todays visit and agrees with plan of treatment. I have discussed any further diagnostic evaluation that may be needed or ordered today. We also reviewed her medications today. she has been encouraged to call the office with any questions or concerns that should arise related to todays visit.  Hypertension Counseling:   The following hypertensive lifestyle modification were recommended and discussed:  1. Limiting alcohol intake to less than 1 oz/day of ethanol:(24 oz of beer or 8 oz of wine or 2 oz of 100-proof whiskey). 2. Take baby ASA 81 mg daily. 3. Importance of regular aerobic exercise and losing weight. 4. Reduce dietary saturated fat and cholesterol intake for overall cardiovascular health. 5. Maintaining adequate dietary potassium, calcium, and magnesium intake. 6. Regular monitoring of the blood pressure. 7. Reduce sodium intake to less than 100 mmol/day (less than 2.3 gm of sodium or less than 6 gm of sodium choride)   Diabetes Counseling:  1. Addition of ACE inh/ ARB'S for nephroprotection. 2. Diabetic foot care, prevention of complications.  3.Exercise and lose weight.  4. Diabetic eye examination, 5. Monitor blood sugar closlely. nutrition counseling.  6.Sign and symptoms of hypoglycemia including shaking sweating,confusion and headaches.     Meds ordered this encounter  Medications  . losartan-hydrochlorothiazide (  HYZAAR) 100-25 MG tablet    Sig: Take 1 tablet by mouth daily.    Dispense:  90 tablet    Refill:  1    Please note increased dose. Will d/c losartan/hctz 100/12.5mg  tablets.    Order Specific Question:   Supervising Provider    Answer:   Lavera Guise [[id number]]    Time spent: 15 Minutes    Dr Lavera Guise Internal medicine

## 2017-08-01 DIAGNOSIS — I10 Essential (primary) hypertension: Secondary | ICD-10-CM | POA: Insufficient documentation

## 2017-08-08 ENCOUNTER — Ambulatory Visit: Admit: 2017-08-08 | Payer: PPO | Admitting: Gastroenterology

## 2017-08-08 SURGERY — COLONOSCOPY WITH PROPOFOL
Anesthesia: General

## 2017-08-10 ENCOUNTER — Telehealth: Payer: Self-pay

## 2017-08-10 ENCOUNTER — Encounter: Payer: Self-pay | Admitting: Internal Medicine

## 2017-08-10 ENCOUNTER — Ambulatory Visit (INDEPENDENT_AMBULATORY_CARE_PROVIDER_SITE_OTHER): Payer: PPO | Admitting: Internal Medicine

## 2017-08-10 VITALS — BP 158/80 | HR 83 | Resp 16 | Ht 66.0 in | Wt 145.2 lb

## 2017-08-10 DIAGNOSIS — J45909 Unspecified asthma, uncomplicated: Secondary | ICD-10-CM

## 2017-08-10 DIAGNOSIS — I10 Essential (primary) hypertension: Secondary | ICD-10-CM | POA: Diagnosis not present

## 2017-08-10 MED ORDER — AZITHROMYCIN 250 MG PO TABS: ORAL_TABLET | ORAL | 0 refills | Status: DC

## 2017-08-10 MED ORDER — BISOPROLOL FUMARATE 5 MG PO TABS: 5.0000 mg | ORAL_TABLET | Freq: Every day | ORAL | 2 refills | Status: DC

## 2017-08-10 MED ORDER — IPRATROPIUM-ALBUTEROL 0.5-2.5 (3) MG/3ML IN SOLN
3.0000 mL | Freq: Once | RESPIRATORY_TRACT | Status: AC
  Administered 2017-08-10: 3 mL via RESPIRATORY_TRACT

## 2017-08-10 NOTE — Telephone Encounter (Signed)
Called in phar zithromax 250 once a day to [facility]

## 2017-08-10 NOTE — Progress Notes (Signed)
[facility] [place], [place] [address]  Internal MEDICINE  Office Visit Note  Patient Name: Rachael Knapp  [id number]  [id number]  Date of Service: 09/05/2017  Chief Complaint  Patient presents with  . Sinusitis    been going on for 1 week OTC meds not working  . Cough  . Nasal Congestion    HPI Pt is here for a sick visit. She is having cough and nasal congestion. Chest tightness, denies fever or chills, BP is elevated    Current Medication:  Outpatient Encounter Medications as of 08/10/2017  Medication Sig  . losartan-hydrochlorothiazide (HYZAAR) 100-25 MG tablet Take 1 tablet by mouth daily.  . simvastatin (ZOCOR) 20 MG tablet Take by mouth.  . [DISCONTINUED] azithromycin (ZITHROMAX) 250 MG tablet Take one tab po qd  . [DISCONTINUED] bisoprolol (ZEBETA) 5 MG tablet Take 1 tablet (5 mg total) by mouth daily.  . [EXPIRED] ipratropium-albuterol (DUONEB) 0.5-2.5 (3) MG/3ML nebulizer solution 3 mL    No facility-administered encounter medications on file as of 08/10/2017.     Medical History: Past Medical History:  Diagnosis Date  . GERD (gastroesophageal reflux disease)   . Hyperlipemia   . Hypertension      Vital Signs: BP (!) 158/80 (BP Location: Left Arm, Patient Position: Sitting, Cuff Size: Normal)   Pulse 83   Resp 16   Ht 5\' 6"  (1.676 m)   Wt 145 lb 3.2 oz (65.9 kg)   SpO2 97%   BMI 23.44 kg/m    Review of Systems  Constitutional: Negative for chills, fatigue and unexpected weight change.  HENT: Positive for postnasal drip, sinus pressure and sinus pain. Negative for congestion, rhinorrhea, sneezing and sore throat.   Eyes: Negative for redness.  Respiratory: Positive for cough. Negative for chest tightness and shortness of breath.   Cardiovascular: Negative for chest pain and palpitations.  Gastrointestinal: Negative for abdominal pain, constipation, diarrhea, nausea and vomiting.  Genitourinary: Negative  for dysuria and frequency.  Musculoskeletal: Negative for arthralgias, back pain, joint swelling and neck pain.  Skin: Negative for rash.  Neurological: Negative.  Negative for tremors and numbness.  Hematological: Negative for adenopathy. Does not bruise/bleed easily.  Psychiatric/Behavioral: Negative for behavioral problems (Depression), sleep disturbance and suicidal ideas. The patient is not nervous/anxious.     Physical Exam  Constitutional: She appears well-developed and well-nourished.  HENT:  Head: Normocephalic and atraumatic.  Right Ear: External ear normal.  Left Ear: External ear normal.  Mouth/Throat: Oropharynx is clear and moist.  Eyes: Pupils are equal, round, and reactive to light. Conjunctivae and EOM are normal.  Neck: Normal range of motion. Neck supple.  Cardiovascular: Normal rate and regular rhythm.  Pulmonary/Chest: She has wheezes.  Abdominal: Soft.  Skin: Skin is dry.   Assessment/Plan: 1. Acute asthmatic bronchitis - ipratropium-albuterol (DUONEB) 0.5-2.5 (3) MG/3ML nebulizer solution 3 mL - Zpak   2. Essential hypertension - Monitor at home   General Counseling: Rachael Knapp understanding of the findings of todays visit and agrees with plan of treatment. I have discussed any further diagnostic evaluation that may be needed or ordered today. We also reviewed her medications today. she has been encouraged to call the office with any questions or concerns that should arise related to todays visit.   Meds ordered this encounter  Medications  . ipratropium-albuterol (DUONEB) 0.5-2.5 (3) MG/3ML nebulizer solution 3 mL  . DISCONTD: azithromycin (ZITHROMAX) 250 MG tablet    Sig: Take one tab po qd  Dispense:  10 tablet    Refill:  0  . DISCONTD: bisoprolol (ZEBETA) 5 MG tablet    Sig: Take 1 tablet (5 mg total) by mouth daily.    Dispense:  30 tablet    Refill:  2    Time spent:15 Minutes

## 2017-08-15 ENCOUNTER — Other Ambulatory Visit: Payer: Self-pay

## 2017-08-15 MED ORDER — BISOPROLOL FUMARATE 5 MG PO TABS: 5.0000 mg | ORAL_TABLET | Freq: Every day | ORAL | 2 refills | Status: DC

## 2017-08-30 ENCOUNTER — Ambulatory Visit: Payer: PPO | Admitting: Nurse Practitioner

## 2017-08-30 ENCOUNTER — Encounter: Payer: Self-pay | Admitting: Nurse Practitioner

## 2017-08-30 VITALS — BP 110/72 | HR 57 | Resp 16 | Ht 66.0 in | Wt 147.6 lb

## 2017-08-30 DIAGNOSIS — I10 Essential (primary) hypertension: Secondary | ICD-10-CM | POA: Diagnosis not present

## 2017-08-30 DIAGNOSIS — E782 Mixed hyperlipidemia: Secondary | ICD-10-CM

## 2017-08-30 NOTE — Progress Notes (Signed)
[facility] [place], [place] [address]  Internal MEDICINE  Office Visit Note  Patient Name: Rachael Knapp  [id number]  [id number]  Date of Service: 09/21/2017   Pt is here for routine follow up.    Chief Complaint  Patient presents with  . Hypertension    The patient's losartan/HCTZ dose was increased at her last visit. Dose is now 100/25mg  daily. She continues bisoprolol 5mg  daily. Her blood pressure is much improved today.   Hypertension  This is a chronic problem. The current episode started more than 1 year ago. The problem has been gradually improving since onset. The problem is controlled. Associated symptoms include malaise/fatigue. Pertinent negatives include no chest pain, headaches, neck pain, palpitations or shortness of breath. Risk factors for coronary artery disease include post-menopausal state, stress and dyslipidemia. Past treatments include ACE inhibitors, beta blockers and diuretics. The current treatment provides mild improvement. There are no compliance problems.        Current Medication: Outpatient Encounter Medications as of 08/30/2017  Medication Sig  . bisoprolol (ZEBETA) 5 MG tablet Take 1 tablet (5 mg total) by mouth daily.  Marland Kitchen losartan-hydrochlorothiazide (HYZAAR) 100-25 MG tablet Take 1 tablet by mouth daily.  . simvastatin (ZOCOR) 20 MG tablet Take by mouth.  . [DISCONTINUED] azithromycin (ZITHROMAX) 250 MG tablet Take one tab po qd   No facility-administered encounter medications on file as of 08/30/2017.     Surgical History: Past Surgical History:  Procedure Laterality Date  . ABDOMINAL HYSTERECTOMY      Medical History: Past Medical History:  Diagnosis Date  . GERD (gastroesophageal reflux disease)   . Hyperlipemia   . Hypertension     Family History: Family History  Problem Relation Age of Onset  . Breast cancer Neg Hx     Social History   Socioeconomic History  . Marital status:  Widowed    Spouse name: Not on file  . Number of children: Not on file  . Years of education: Not on file  . Highest education level: Not on file  Occupational History  . Not on file  Social Needs  . Financial resource strain: Not on file  . Food insecurity:    Worry: Not on file    Inability: Not on file  . Transportation needs:    Medical: Not on file    Non-medical: Not on file  Tobacco Use  . Smoking status: Never Smoker  . Smokeless tobacco: Never Used  Substance and Sexual Activity  . Alcohol use: No  . Drug use: Not on file  . Sexual activity: Not on file  Lifestyle  . Physical activity:    Days per week: Not on file    Minutes per session: Not on file  . Stress: Not on file  Relationships  . Social connections:    Talks on phone: Not on file    Gets together: Not on file    Attends religious service: Not on file    Active member of club or organization: Not on file    Attends meetings of clubs or organizations: Not on file    Relationship status: Not on file  . Intimate partner violence:    Fear of current or ex partner: Not on file    Emotionally abused: Not on file    Physically abused: Not on file    Forced sexual activity: Not on file  Other Topics Concern  . Not on file  Social History Narrative  .  Not on file      Review of Systems  Constitutional: Positive for malaise/fatigue. Negative for activity change, chills, fatigue and unexpected weight change.  HENT: Negative for congestion, postnasal drip, rhinorrhea, sinus pressure, sinus pain, sneezing and sore throat.   Eyes: Negative.  Negative for redness.  Respiratory: Negative for cough, chest tightness and shortness of breath.   Cardiovascular: Negative for chest pain and palpitations.       Blood pressure improved.   Gastrointestinal: Negative for abdominal pain, constipation, diarrhea, nausea and vomiting.  Endocrine: Negative for cold intolerance, heat intolerance, polydipsia, polyphagia and  polyuria.  Genitourinary: Negative for dysuria, frequency and urgency.  Musculoskeletal: Negative for arthralgias, back pain, joint swelling and neck pain.  Skin: Negative for rash.  Allergic/Immunologic: Negative for environmental allergies.  Neurological: Negative for dizziness, tremors, numbness and headaches.  Hematological: Negative for adenopathy. Does not bruise/bleed easily.  Psychiatric/Behavioral: Negative for behavioral problems (Depression), sleep disturbance and suicidal ideas. The patient is not nervous/anxious.    Today's Vitals   08/30/17 1444  BP: 110/72  Pulse: (!) 57  Resp: 16  SpO2: 98%  Weight: 147 lb 9.6 oz (67 kg)  Height: 5\' 6"  (1.676 m)    Physical Exam  Constitutional: She is oriented to person, place, and time. She appears well-developed and well-nourished. No distress.  HENT:  Head: Normocephalic and atraumatic.  Nose: Nose normal.  Mouth/Throat: Oropharynx is clear and moist. No oropharyngeal exudate.  Eyes: Pupils are equal, round, and reactive to light. Conjunctivae and EOM are normal.  Neck: Normal range of motion. Neck supple. No JVD present. Carotid bruit is not present. No tracheal deviation present. No thyromegaly present.  Cardiovascular: Normal rate, regular rhythm and normal heart sounds. Exam reveals no gallop and no friction rub.  No murmur heard. Pulmonary/Chest: Effort normal and breath sounds normal. No respiratory distress. She has no wheezes. She has no rales. She exhibits no tenderness.  Abdominal: Soft. Bowel sounds are normal. There is no tenderness.  Musculoskeletal: Normal range of motion.  Lymphadenopathy:    She has no cervical adenopathy.  Neurological: She is alert and oriented to person, place, and time. No cranial nerve deficit.  Skin: Skin is warm and dry. She is not diaphoretic.  Psychiatric: She has a normal mood and affect. Her behavior is normal. Judgment and thought content normal.  Nursing note and vitals  reviewed.  Assessment/Plan: 1. Essential hypertension Much improved. Continue losartan/HCTZ 100/25mg  and bisoprolol 5mg , both daily.   2. Mixed hyperlipidemia Continue zocor as prescribed.   General Counseling: [name] understanding of the findings of todays visit and agrees with plan of treatment. I have discussed any further diagnostic evaluation that may be needed or ordered today. We also reviewed her medications today. she has been encouraged to call the office with any questions or concerns that should arise related to todays visit.  This patient was seen by [name], FNP- C in Collaboration with Dr Lavera Guise as a part of collaborative care agreement   Time spent: 92 Minutes       Dr Lavera Guise Internal medicine

## 2017-09-05 ENCOUNTER — Encounter: Payer: Self-pay | Admitting: Internal Medicine

## 2017-09-22 ENCOUNTER — Telehealth: Payer: Self-pay

## 2017-09-22 NOTE — Telephone Encounter (Signed)
Got call from pt's pharmacy about her losartan/HCTZ.  Pt was telling pharmacy that she was taking the 100/12.5mg  dose but that was changed in march 2019 visit to the 100/25 mg dose.  I left vm advising pt that this was the change and she should be taking the new dose.  I also advised the pharmacy that pt is to be taking the 100/25 mg dose.  dbs

## 2017-12-30 ENCOUNTER — Ambulatory Visit (INDEPENDENT_AMBULATORY_CARE_PROVIDER_SITE_OTHER): Payer: PPO | Admitting: Nurse Practitioner

## 2017-12-30 ENCOUNTER — Encounter: Payer: Self-pay | Admitting: Nurse Practitioner

## 2017-12-30 VITALS — BP 142/55 | HR 50 | Resp 16 | Ht 66.0 in | Wt 152.8 lb

## 2017-12-30 DIAGNOSIS — Z1239 Encounter for other screening for malignant neoplasm of breast: Secondary | ICD-10-CM

## 2017-12-30 DIAGNOSIS — E782 Mixed hyperlipidemia: Secondary | ICD-10-CM

## 2017-12-30 DIAGNOSIS — Z1211 Encounter for screening for malignant neoplasm of colon: Secondary | ICD-10-CM | POA: Insufficient documentation

## 2017-12-30 DIAGNOSIS — Z0001 Encounter for general adult medical examination with abnormal findings: Secondary | ICD-10-CM

## 2017-12-30 DIAGNOSIS — I10 Essential (primary) hypertension: Secondary | ICD-10-CM

## 2017-12-30 DIAGNOSIS — Z1231 Encounter for screening mammogram for malignant neoplasm of breast: Secondary | ICD-10-CM

## 2017-12-30 DIAGNOSIS — E559 Vitamin D deficiency, unspecified: Secondary | ICD-10-CM

## 2017-12-30 DIAGNOSIS — Z23 Encounter for immunization: Secondary | ICD-10-CM | POA: Diagnosis not present

## 2017-12-30 DIAGNOSIS — R300 Dysuria: Secondary | ICD-10-CM

## 2017-12-30 MED ORDER — SIMVASTATIN 20 MG PO TABS: 20.0000 mg | ORAL_TABLET | Freq: Every day | ORAL | 5 refills | Status: DC

## 2017-12-30 MED ORDER — BISOPROLOL FUMARATE 5 MG PO TABS: 5.0000 mg | ORAL_TABLET | Freq: Every day | ORAL | 5 refills | Status: DC

## 2017-12-30 MED ORDER — LOSARTAN POTASSIUM-HCTZ 100-25 MG PO TABS: 1.0000 | ORAL_TABLET | Freq: Every day | ORAL | 5 refills | Status: DC

## 2017-12-30 NOTE — Progress Notes (Signed)
[facility] [place], [place] [address]  Internal MEDICINE  Office Visit Note  Patient Name: Rachael Knapp  [id number]  [id number]  Date of Service: 12/30/2017   Pt is here for routine health maintenance examination  Chief Complaint  Patient presents with  . Annual Exam    4 month visit.   Marland Kitchen Hypertension  . Quality Metric Gaps    pneumovax     Hypertension  This is a chronic problem. The current episode started more than 1 year ago. The problem is unchanged. The problem is controlled. Pertinent negatives include no chest pain, headaches, neck pain, palpitations or shortness of breath. There are no associated agents to hypertension. Risk factors for coronary artery disease include dyslipidemia and post-menopausal state. Past treatments include beta blockers, ACE inhibitors and diuretics. The current treatment provides moderate improvement. There are no compliance problems.      Current Medication: Outpatient Encounter Medications as of 12/30/2017  Medication Sig  . bisoprolol (ZEBETA) 5 MG tablet Take 1 tablet (5 mg total) by mouth daily.  Marland Kitchen losartan-hydrochlorothiazide (HYZAAR) 100-25 MG tablet Take 1 tablet by mouth daily.  . simvastatin (ZOCOR) 20 MG tablet Take 1 tablet (20 mg total) by mouth daily at 6 PM.  . [DISCONTINUED] bisoprolol (ZEBETA) 5 MG tablet Take 1 tablet (5 mg total) by mouth daily.  . [DISCONTINUED] losartan-hydrochlorothiazide (HYZAAR) 100-25 MG tablet Take 1 tablet by mouth daily.  . [DISCONTINUED] simvastatin (ZOCOR) 20 MG tablet Take by mouth.   No facility-administered encounter medications on file as of 12/30/2017.     Surgical History: Past Surgical History:  Procedure Laterality Date  . ABDOMINAL HYSTERECTOMY      Medical History: Past Medical History:  Diagnosis Date  . GERD (gastroesophageal reflux disease)   . Hyperlipemia   . Hypertension     Family History: Family History  Problem  Relation Age of Onset  . Breast cancer Neg Hx       Review of Systems  Constitutional: Negative for activity change, chills, fatigue and unexpected weight change.  HENT: Negative for congestion, postnasal drip, rhinorrhea, sinus pressure, sinus pain, sneezing and sore throat.   Eyes: Negative.  Negative for redness.  Respiratory: Negative for cough, chest tightness and shortness of breath.   Cardiovascular: Negative for chest pain and palpitations.  Gastrointestinal: Negative for abdominal pain, constipation, diarrhea, nausea and vomiting.  Endocrine: Negative for cold intolerance, heat intolerance, polydipsia, polyphagia and polyuria.  Genitourinary: Negative for dysuria, frequency and urgency.  Musculoskeletal: Negative for arthralgias, back pain, joint swelling and neck pain.  Skin: Negative for rash.  Allergic/Immunologic: Negative for environmental allergies.  Neurological: Negative for dizziness, tremors, numbness and headaches.  Hematological: Negative for adenopathy. Does not bruise/bleed easily.  Psychiatric/Behavioral: Negative for behavioral problems (Depression), sleep disturbance and suicidal ideas. The patient is not nervous/anxious.      Today's Vitals   12/30/17 1411  BP: (!) 142/55  Pulse: (!) 50  Resp: 16  SpO2: 98%  Weight: 152 lb 12.8 oz (69.3 kg)  Height: 5\' 6"  (1.676 m)    Physical Exam  Constitutional: She is oriented to person, place, and time. She appears well-developed and well-nourished. No distress.  HENT:  Head: Normocephalic and atraumatic.  Nose: Nose normal.  Mouth/Throat: Oropharynx is clear and moist. No oropharyngeal exudate.  Eyes: Pupils are equal, round, and reactive to light. Conjunctivae and EOM are normal.  Neck: Normal range of motion. Neck supple. No JVD present. Carotid bruit is  not present. No tracheal deviation present. No thyromegaly present.  Cardiovascular: Normal rate, regular rhythm, normal heart sounds and intact distal  pulses. Exam reveals no gallop and no friction rub.  No murmur heard. Pulmonary/Chest: Effort normal and breath sounds normal. No respiratory distress. She has no wheezes. She has no rales. She exhibits no tenderness.  Abdominal: Soft. Bowel sounds are normal. There is no tenderness.  Musculoskeletal: Normal range of motion.  Lymphadenopathy:    She has no cervical adenopathy.  Neurological: She is alert and oriented to person, place, and time. No cranial nerve deficit.  Skin: Skin is warm and dry. Capillary refill takes less than 2 seconds. She is not diaphoretic.  Psychiatric: She has a normal mood and affect. Her behavior is normal. Judgment and thought content normal.  Nursing note and vitals reviewed.  Assessment/Plan: 1. Encounter for general adult medical examination with abnormal findings Annual wellness visit today.  - CBC with Differential/Platelet - Comprehensive metabolic panel - T4, free - TSH  2. Essential hypertension Stable. Continue bp medication as prescribed.  - losartan-hydrochlorothiazide (HYZAAR) 100-25 MG tablet; Take 1 tablet by mouth daily.  Dispense: 30 tablet; Refill: 5 - Comprehensive metabolic panel - TSH  3. Mixed hyperlipidemia Check fasting lipid panel. Adjust simvastatin as indicated . - bisoprolol (ZEBETA) 5 MG tablet; Take 1 tablet (5 mg total) by mouth daily.  Dispense: 30 tablet; Refill: 5 - simvastatin (ZOCOR) 20 MG tablet; Take 1 tablet (20 mg total) by mouth daily at 6 PM.  Dispense: 30 tablet; Refill: 5 - Comprehensive metabolic panel - TSH - Lipid panel  4. Vitamin D deficiency - Vitamin D 1,25 dihydroxy  5. Screening for breast cancer - MM DIGITAL SCREENING BILATERAL; Future  6. Screening for colon cancer - Ambulatory referral to Gastroenterology  7. Need for vaccination against Streptococcus pneumoniae using pneumococcal conjugate vaccine 13 - Pneumococcal conjugate vaccine 13-valent IM   General Counseling: [name] understanding of the findings of todays visit and agrees with plan of treatment. I have discussed any further diagnostic evaluation that may be needed or ordered today. We also reviewed her medications today. she has been encouraged to call the office with any questions or concerns that should arise related to todays visit.    Counseling:  Hypertension Counseling:   The following hypertensive lifestyle modification were recommended and discussed:  1. Limiting alcohol intake to less than 1 oz/day of ethanol:(24 oz of beer or 8 oz of wine or 2 oz of 100-proof whiskey). 2. Take baby ASA 81 mg daily. 3. Importance of regular aerobic exercise and losing weight. 4. Reduce dietary saturated fat and cholesterol intake for overall cardiovascular health. 5. Maintaining adequate dietary potassium, calcium, and magnesium intake. 6. Regular monitoring of the blood pressure. 7. Reduce sodium intake to less than 100 mmol/day (less than 2.3 gm of sodium or less than 6 gm of sodium choride)   This patient was seen by [place] with Dr Lavera Guise as a part of collaborative care agreement  Orders Placed This Encounter  Procedures  . MM DIGITAL SCREENING BILATERAL  . Pneumococcal conjugate vaccine 13-valent IM  . UA/M w/rflx Culture, Routine  . CBC with Differential/Platelet  . Comprehensive metabolic panel  . T4, free  . TSH  . Lipid panel  . Vitamin D 1,25 dihydroxy  . Ambulatory referral to Gastroenterology    Meds ordered this encounter  Medications  . bisoprolol (ZEBETA) 5 MG tablet    Sig: Take  1 tablet (5 mg total) by mouth daily.    Dispense:  30 tablet    Refill:  5    Order Specific Question:   Supervising Provider    Answer:   Lavera Guise [[id number]]  . losartan-hydrochlorothiazide (HYZAAR) 100-25 MG tablet    Sig: Take 1 tablet by mouth daily.    Dispense:  30 tablet    Refill:  5    Order Specific Question:   Supervising Provider    Answer:    Lavera Guise [[id number]]  . simvastatin (ZOCOR) 20 MG tablet    Sig: Take 1 tablet (20 mg total) by mouth daily at 6 PM.    Dispense:  30 tablet    Refill:  5    Order Specific Question:   Supervising Provider    Answer:   Lavera Guise [[id number]]    Time spent: [place], MD  Internal Medicine

## 2018-01-03 LAB — UA/M W/RFLX CULTURE, ROUTINE
Bilirubin, UA: NEGATIVE
Glucose, UA: NEGATIVE
Ketones, UA: NEGATIVE
Nitrite, UA: NEGATIVE
PH UA: 6.5 (ref 5.0–7.5)
Protein, UA: NEGATIVE
RBC UA: NEGATIVE
Specific Gravity, UA: 1.007 (ref 1.005–1.030)
UUROB: 0.2 mg/dL (ref 0.2–1.0)

## 2018-01-03 LAB — URINE CULTURE, REFLEX

## 2018-01-03 LAB — MICROSCOPIC EXAMINATION: CASTS: NONE SEEN /LPF

## 2018-01-04 ENCOUNTER — Other Ambulatory Visit: Payer: Self-pay | Admitting: Nurse Practitioner

## 2018-01-04 ENCOUNTER — Telehealth: Payer: Self-pay

## 2018-01-04 DIAGNOSIS — N390 Urinary tract infection, site not specified: Secondary | ICD-10-CM

## 2018-01-04 MED ORDER — NITROFURANTOIN MONOHYD MACRO 100 MG PO CAPS: 100.0000 mg | ORAL_CAPSULE | Freq: Two times a day (BID) | ORAL | 0 refills | Status: DC

## 2018-01-04 NOTE — Progress Notes (Signed)
Urine sample from CPE done 12/30/2017 showed evidence of uti. Start macrobid 100mg  bid for 10 days. New rx sent to her pharmacy.

## 2018-01-04 NOTE — Telephone Encounter (Signed)
lmom to call back 

## 2018-01-05 ENCOUNTER — Telehealth: Payer: Self-pay

## 2018-01-05 ENCOUNTER — Other Ambulatory Visit: Payer: Self-pay | Admitting: Nurse Practitioner

## 2018-01-05 DIAGNOSIS — I10 Essential (primary) hypertension: Secondary | ICD-10-CM | POA: Diagnosis not present

## 2018-01-05 DIAGNOSIS — E782 Mixed hyperlipidemia: Secondary | ICD-10-CM | POA: Diagnosis not present

## 2018-01-05 DIAGNOSIS — E559 Vitamin D deficiency, unspecified: Secondary | ICD-10-CM | POA: Diagnosis not present

## 2018-01-05 DIAGNOSIS — Z0001 Encounter for general adult medical examination with abnormal findings: Secondary | ICD-10-CM | POA: Diagnosis not present

## 2018-01-05 NOTE — Telephone Encounter (Signed)
Pt advised we send macrobd to [facility] for uti

## 2018-01-06 LAB — COMPREHENSIVE METABOLIC PANEL
ALT: 15 IU/L (ref 0–32)
AST: 24 IU/L (ref 0–40)
Albumin/Globulin Ratio: 1.4 (ref 1.2–2.2)
Albumin: 4.4 g/dL (ref 3.5–4.8)
Alkaline Phosphatase: 92 IU/L (ref 39–117)
BUN / CREAT RATIO: 11 — AB (ref 12–28)
BUN: 10 mg/dL (ref 8–27)
Bilirubin Total: 0.4 mg/dL (ref 0.0–1.2)
CHLORIDE: 97 mmol/L (ref 96–106)
CO2: 25 mmol/L (ref 20–29)
Calcium: 9.4 mg/dL (ref 8.7–10.3)
Creatinine, Ser: 0.92 mg/dL (ref 0.57–1.00)
GFR, EST AFRICAN AMERICAN: 69 mL/min/{1.73_m2} (ref >59)
GFR, EST NON AFRICAN AMERICAN: 60 mL/min/{1.73_m2} (ref >59)
GLOBULIN, TOTAL: 3.1 g/dL (ref 1.5–4.5)
Glucose: 103 mg/dL — ABNORMAL HIGH (ref 65–99)
POTASSIUM: 3.9 mmol/L (ref 3.5–5.2)
Sodium: 139 mmol/L (ref 134–144)
Total Protein: 7.5 g/dL (ref 6.0–8.5)

## 2018-01-06 LAB — TSH: TSH: 3.37 u[IU]/mL (ref 0.450–4.500)

## 2018-01-06 LAB — VITAMIN D 25 HYDROXY (VIT D DEFICIENCY, FRACTURES): Vit D, 25-Hydroxy: 26.3 ng/mL — ABNORMAL LOW (ref 30.0–100.0)

## 2018-01-06 LAB — CBC
HEMATOCRIT: 40.1 % (ref 34.0–46.6)
HEMOGLOBIN: 14 g/dL (ref 11.1–15.9)
MCH: 30.9 pg (ref 26.6–33.0)
MCHC: 34.9 g/dL (ref 31.5–35.7)
MCV: 89 fL (ref 79–97)
Platelets: 150 10*3/uL (ref 150–450)
RBC: 4.53 x10E6/uL (ref 3.77–5.28)
RDW: 13.2 % (ref 12.3–15.4)
WBC: 4.2 10*3/uL (ref 3.4–10.8)

## 2018-01-06 LAB — LIPID PANEL W/O CHOL/HDL RATIO
Cholesterol, Total: 234 mg/dL — ABNORMAL HIGH (ref 100–199)
HDL: 59 mg/dL (ref >39)
LDL CALC: 150 mg/dL — AB (ref 0–99)
TRIGLYCERIDES: 125 mg/dL (ref 0–149)
VLDL Cholesterol Cal: 25 mg/dL (ref 5–40)

## 2018-01-06 LAB — T4, FREE: Free T4: 1.1 ng/dL (ref 0.82–1.77)

## 2018-01-24 ENCOUNTER — Encounter: Payer: Self-pay | Admitting: *Deleted

## 2018-02-13 ENCOUNTER — Other Ambulatory Visit: Payer: Self-pay

## 2018-02-13 DIAGNOSIS — Z1211 Encounter for screening for malignant neoplasm of colon: Secondary | ICD-10-CM

## 2018-02-22 ENCOUNTER — Telehealth: Payer: Self-pay

## 2018-02-22 NOTE — Telephone Encounter (Signed)
Patient contacted office to cancel her colonoscopy scheduled for 02/27/18.  States her colonoscopy was supposed to be scheduled with [facility] not [facility].  Thanks [employer]

## 2018-02-23 ENCOUNTER — Ambulatory Visit
Admission: RE | Admit: 2018-02-23 | Discharge: 2018-02-23 | Disposition: A | Discharge status: Home / Self Care | Payer: PPO | Source: Ambulatory Visit | Attending: Nurse Practitioner | Admitting: Nurse Practitioner

## 2018-02-23 DIAGNOSIS — Z1239 Encounter for other screening for malignant neoplasm of breast: Secondary | ICD-10-CM

## 2018-02-23 DIAGNOSIS — Z1231 Encounter for screening mammogram for malignant neoplasm of breast: Secondary | ICD-10-CM | POA: Diagnosis not present

## 2018-02-27 ENCOUNTER — Ambulatory Visit: Admit: 2018-02-27 | Payer: PPO | Admitting: Gastroenterology

## 2018-02-27 SURGERY — COLONOSCOPY WITH PROPOFOL
Anesthesia: General

## 2018-03-13 DIAGNOSIS — I10 Essential (primary) hypertension: Secondary | ICD-10-CM | POA: Diagnosis not present

## 2018-03-13 DIAGNOSIS — Z8371 Family history of colonic polyps: Secondary | ICD-10-CM | POA: Diagnosis not present

## 2018-05-15 ENCOUNTER — Ambulatory Visit: Payer: PPO | Admitting: Anesthesiology

## 2018-05-15 ENCOUNTER — Encounter: Payer: Self-pay | Admitting: Student

## 2018-05-15 ENCOUNTER — Encounter
Admission: RE | Disposition: A | Discharge status: Home / Self Care | Payer: Self-pay | Source: Home / Self Care | Attending: Gastroenterology

## 2018-05-15 ENCOUNTER — Other Ambulatory Visit: Payer: Self-pay

## 2018-05-15 ENCOUNTER — Ambulatory Visit
Admission: RE | Admit: 2018-05-15 | Discharge: 2018-05-15 | Disposition: A | Discharge status: Home / Self Care | Payer: PPO | Attending: Gastroenterology | Admitting: Gastroenterology

## 2018-05-15 DIAGNOSIS — Z800 Family history of malignant neoplasm of digestive organs: Secondary | ICD-10-CM | POA: Diagnosis not present

## 2018-05-15 DIAGNOSIS — K621 Rectal polyp: Secondary | ICD-10-CM | POA: Insufficient documentation

## 2018-05-15 DIAGNOSIS — D125 Benign neoplasm of sigmoid colon: Secondary | ICD-10-CM | POA: Insufficient documentation

## 2018-05-15 DIAGNOSIS — K640 First degree hemorrhoids: Secondary | ICD-10-CM | POA: Diagnosis not present

## 2018-05-15 DIAGNOSIS — Z79899 Other long term (current) drug therapy: Secondary | ICD-10-CM | POA: Diagnosis not present

## 2018-05-15 DIAGNOSIS — K219 Gastro-esophageal reflux disease without esophagitis: Secondary | ICD-10-CM | POA: Insufficient documentation

## 2018-05-15 DIAGNOSIS — D126 Benign neoplasm of colon, unspecified: Secondary | ICD-10-CM | POA: Diagnosis not present

## 2018-05-15 DIAGNOSIS — Z881 Allergy status to other antibiotic agents status: Secondary | ICD-10-CM | POA: Insufficient documentation

## 2018-05-15 DIAGNOSIS — I10 Essential (primary) hypertension: Secondary | ICD-10-CM | POA: Diagnosis not present

## 2018-05-15 DIAGNOSIS — Z8371 Family history of colonic polyps: Secondary | ICD-10-CM | POA: Insufficient documentation

## 2018-05-15 DIAGNOSIS — K5730 Diverticulosis of large intestine without perforation or abscess without bleeding: Secondary | ICD-10-CM | POA: Diagnosis not present

## 2018-05-15 DIAGNOSIS — Z1211 Encounter for screening for malignant neoplasm of colon: Secondary | ICD-10-CM | POA: Insufficient documentation

## 2018-05-15 DIAGNOSIS — K648 Other hemorrhoids: Secondary | ICD-10-CM | POA: Diagnosis not present

## 2018-05-15 DIAGNOSIS — K5790 Diverticulosis of intestine, part unspecified, without perforation or abscess without bleeding: Secondary | ICD-10-CM | POA: Diagnosis not present

## 2018-05-15 DIAGNOSIS — E785 Hyperlipidemia, unspecified: Secondary | ICD-10-CM | POA: Insufficient documentation

## 2018-05-15 DIAGNOSIS — K635 Polyp of colon: Secondary | ICD-10-CM | POA: Diagnosis not present

## 2018-05-15 HISTORY — PX: COLONOSCOPY WITH PROPOFOL: SHX5780

## 2018-05-15 SURGERY — COLONOSCOPY WITH PROPOFOL
Anesthesia: General

## 2018-05-15 MED ORDER — EPHEDRINE SULFATE 50 MG/ML IJ SOLN
INTRAMUSCULAR | Status: AC
  Filled 2018-05-15: qty 1

## 2018-05-15 MED ORDER — SODIUM CHLORIDE 0.9 % IV SOLN: INTRAVENOUS | Status: DC

## 2018-05-15 MED ORDER — PROPOFOL 500 MG/50ML IV EMUL
INTRAVENOUS | Status: DC | PRN
  Administered 2018-05-15: 120 ug/kg/min via INTRAVENOUS

## 2018-05-15 MED ORDER — SODIUM CHLORIDE 0.9 % IV SOLN
INTRAVENOUS | Status: DC
  Administered 2018-05-15 (×2): via INTRAVENOUS

## 2018-05-15 MED ORDER — PROPOFOL 500 MG/50ML IV EMUL
INTRAVENOUS | Status: AC
  Filled 2018-05-15: qty 50

## 2018-05-15 MED ORDER — FENTANYL CITRATE (PF) 100 MCG/2ML IJ SOLN
INTRAMUSCULAR | Status: AC
  Filled 2018-05-15: qty 2

## 2018-05-15 MED ORDER — ONDANSETRON HCL 4 MG/2ML IJ SOLN
INTRAMUSCULAR | Status: AC
  Filled 2018-05-15: qty 2

## 2018-05-15 MED ORDER — ONDANSETRON HCL 4 MG/2ML IJ SOLN
INTRAMUSCULAR | Status: DC | PRN
  Administered 2018-05-15: 4 mg via INTRAVENOUS

## 2018-05-15 MED ORDER — EPHEDRINE SULFATE 50 MG/ML IJ SOLN
INTRAMUSCULAR | Status: DC | PRN
  Administered 2018-05-15: 5 mg via INTRAVENOUS
  Administered 2018-05-15 (×2): 10 mg via INTRAVENOUS

## 2018-05-15 MED ORDER — FENTANYL CITRATE (PF) 100 MCG/2ML IJ SOLN
INTRAMUSCULAR | Status: DC | PRN
  Administered 2018-05-15: 50 ug via INTRAVENOUS

## 2018-05-15 NOTE — Anesthesia Post-op Follow-up Note (Signed)
Anesthesia QCDR form completed.        

## 2018-05-15 NOTE — Transfer of Care (Signed)
Immediate Anesthesia Transfer of Care Note  Patient: Rachael Knapp  Procedure(s) Performed: COLONOSCOPY WITH PROPOFOL (N/A )  Patient Location: PACU  Anesthesia Type:General  Level of Consciousness: awake and sedated  Airway & Oxygen Therapy: Patient Spontanous Breathing and Patient connected to nasal cannula oxygen  Post-op Assessment: Report given to RN and Post -op Vital signs reviewed and stable  Post vital signs: Reviewed and stable  Last Vitals:  Vitals Value Taken Time  BP    Temp    Pulse    Resp    SpO2      Last Pain:  Vitals:   05/15/18 0801  TempSrc: Tympanic  PainSc: 0-No pain         Complications: No apparent anesthesia complications

## 2018-05-15 NOTE — Anesthesia Procedure Notes (Signed)
Performed by: Cook-Martin, Berle Fitz Pre-anesthesia Checklist: Patient identified, Emergency Drugs available, Suction available, Patient being monitored and Timeout performed Patient Re-evaluated:Patient Re-evaluated prior to induction Oxygen Delivery Method: Nasal cannula Preoxygenation: Pre-oxygenation with 100% oxygen Induction Type: IV induction Placement Confirmation: positive ETCO2 and CO2 detector       

## 2018-05-15 NOTE — Op Note (Addendum)
[facility] Gastroenterology Patient Name: Rachael Knapp Procedure Date: 05/15/2018 9:02 AM MRN: [id number] Account #: [id number] Date of Birth: [birth year] Admit Type: Outpatient Age: 78 Room: [facility] ENDO ROOM 1 Gender: Female Note Status: Finalized Procedure:            Colonoscopy Indications:          Colon cancer screening in patient at increased risk:                        Family history of 1st-degree relative with colon polyps Providers:            Lollie Sails, MD Referring MD:         Lavera Guise, MD (Referring MD) Medicines:            Monitored Anesthesia Care Complications:        No immediate complications. Procedure:            Pre-Anesthesia Assessment:                       - ASA Grade Assessment: II - A patient with mild                        systemic disease.                       After obtaining informed consent, the colonoscope was                        passed under direct vision. Throughout the procedure,                        the patient's blood pressure, pulse, and oxygen                        saturations were monitored continuously. The                        Colonoscope was introduced through the anus and                        advanced to the the cecum, identified by appendiceal                        orifice and ileocecal valve. The colonoscopy was                        performed without difficulty. The patient tolerated the                        procedure well. The quality of the bowel preparation                        was good. Findings:      Multiple small-mouthed diverticula were found in the sigmoid colon and       descending colon.      A 4 mm polyp was found in the distal sigmoid colon. The polyp was       sessile. The polyp was removed with a cold snare. Resection and       retrieval were complete.      A 2 mm  polyp was found in the distal sigmoid colon. The polyp was       sessile. The polyp was removed  with a cold biopsy forceps. Resection and       retrieval were complete.      A 1 mm polyp was found in the rectum. The polyp was sessile. The polyp       was removed with a cold biopsy forceps. Resection and retrieval were       complete.      Non-bleeding internal hemorrhoids were found during retroflexion. The       hemorrhoids were minimal and Grade I (internal hemorrhoids that do not       prolapse).      No additional abnormalities were found on retroflexion.      The digital rectal exam was normal. Impression:           - Diverticulosis in the sigmoid colon and in the                        descending colon.                       - One 4 mm polyp in the distal sigmoid colon, removed                        with a cold snare. Resected and retrieved.                       - One 2 mm polyp in the distal sigmoid colon, removed                        with a cold biopsy forceps. Resected and retrieved.                       - One 1 mm polyp in the rectum, removed with a cold                        biopsy forceps. Resected and retrieved.                       - Non-bleeding internal hemorrhoids. Recommendation:       - Discharge patient to home. Procedure Code(s):    --- Professional ---                       [phone], Colonoscopy, flexible; with removal of tumor(s),                        polyp(s), or other lesion(s) by snare technique                       45380, 63, Colonoscopy, flexible; with biopsy, single                        or multiple Diagnosis Code(s):    --- Professional ---                       Z83.71, Family history of colonic polyps                       K64.0, First degree hemorrhoids  D12.5, Benign neoplasm of sigmoid colon                       K62.1, Rectal polyp                       K57.30, Diverticulosis of large intestine without                        perforation or abscess without bleeding CPT copyright 2018 American Medical Association. All  rights reserved. The codes documented in this report are preliminary and upon coder review may  be revised to meet current compliance requirements. Lollie Sails, MD 05/15/2018 9:56:12 AM This report has been signed electronically. Number of Addenda: 0 Note Initiated On: 05/15/2018 9:02 AM Scope Withdrawal Time: 0 hours 15 minutes 9 seconds  Total Procedure Duration: 0 hours 34 minutes 45 seconds       [facility]

## 2018-05-15 NOTE — H&P (Signed)
Outpatient short stay form Pre-procedure 05/15/2018 9:08 AM Rachael Sails MD  Primary Physician: [name] NP  Reason for visit: Colonoscopy  History of present illness: Rachael Knapp is a 78 year old female presenting today for colonoscopy in regards to her family history of colon cancer multiple primary relatives.  Last colonoscopy was 08/16/2011 and no polyps were found at that time.  She tolerated her prep well.  She takes no aspirin or blood thinning agent.    Current Facility-Administered Medications:  .  0.9 %  sodium chloride infusion, , Intravenous, Continuous, Rachael Sails, MD .  0.9 %  sodium chloride infusion, , Intravenous, Continuous, Rachael Sails, MD, Last Rate: 20 mL/hr at 05/15/18 6808  Medications Prior to Admission  Medication Sig Dispense Refill Last Dose  . bisoprolol (ZEBETA) 5 MG tablet Take 1 tablet (5 mg total) by mouth daily. 30 tablet 5 05/14/2018 at 2200  . CHLORPHENIRAMINE-PHENYLEPHRINE PO Take by mouth.   05/14/2018 at 0800  . losartan-hydrochlorothiazide (HYZAAR) 100-12.5 MG tablet Take 1 tablet by mouth daily.   05/15/2018 at 0450  . simvastatin (ZOCOR) 20 MG tablet Take 1 tablet (20 mg total) by mouth daily at 6 PM. 30 tablet 5 05/14/2018 at 1800  . losartan-hydrochlorothiazide (HYZAAR) 100-25 MG tablet Take 1 tablet by mouth daily. 30 tablet 5  at Unknown time  . nitrofurantoin, macrocrystal-monohydrate, (MACROBID) 100 MG capsule Take 1 capsule (100 mg total) by mouth 2 (two) times daily. 20 capsule 0      Allergies  Allergen Reactions  . Amoxicillin Shortness Of Breath  . Ranitidine Palpitations  . Clindamycin/Lincomycin Rash     Past Medical History:  Diagnosis Date  . GERD (gastroesophageal reflux disease)   . Hyperlipemia   . Hypertension     Review of systems:      Physical Exam    Heart and lungs: Rhythm without rub or gallop, lungs are bilaterally clear.    HEENT: Normocephalic atraumatic eyes are anicteric     Other:    Pertinant exam for procedure: Soft nontender nondistended bowel sounds positive normoactive.  Of note Rachael Knapp has a small midline incisional hernia several centimeters above the umbilicus.  This is nontender.    Planned proceedures: Colonoscopy and indicated procedures. I have discussed the risks benefits and complications of procedures to include not limited to bleeding, infection, perforation and the risk of sedation and the Rachael Knapp wishes to proceed.    Rachael Sails, MD Gastroenterology 05/15/2018  9:08 AM

## 2018-05-15 NOTE — Anesthesia Postprocedure Evaluation (Signed)
Anesthesia Post Note  Patient: Rachael Knapp  Procedure(s) Performed: COLONOSCOPY WITH PROPOFOL (N/A )  Patient location during evaluation: PACU Anesthesia Type: General Level of consciousness: awake and alert Pain management: pain level controlled Vital Signs Assessment: post-procedure vital signs reviewed and stable Respiratory status: spontaneous breathing, nonlabored ventilation and respiratory function stable Cardiovascular status: blood pressure returned to baseline and stable Postop Assessment: no apparent nausea or vomiting Anesthetic complications: no     Last Vitals:  Vitals:   05/15/18 1017 05/15/18 1027  BP: (!) 135/53 (!) 132/55  Pulse: 66 61  Resp: 15 17  Temp:    SpO2: 94% 95%    Last Pain:  Vitals:   05/15/18 1027  TempSrc:   PainSc: 0-No pain                 Durenda Hurt

## 2018-05-15 NOTE — Anesthesia Preprocedure Evaluation (Addendum)
Anesthesia Evaluation  Patient identified by MRN, date of birth, ID band Patient awake    Reviewed: Allergy & Precautions, H&P , NPO status , Patient's Chart, lab work & pertinent test results  History of Anesthesia Complications (+) PONV and history of anesthetic complications (pt states she does well when given Zofran)  Airway Mallampati: II  TM Distance: >3 FB     Dental  (+) Teeth Intact   Pulmonary neg pulmonary ROS,           Cardiovascular hypertension,      Neuro/Psych negative neurological ROS  negative psych ROS   GI/Hepatic Neg liver ROS, GERD  Controlled,  Endo/Other  negative endocrine ROS  Renal/GU negative Renal ROS  negative genitourinary   Musculoskeletal   Abdominal   Peds  Hematology negative hematology ROS (+)   Anesthesia Other Findings Past Medical History: No date: GERD (gastroesophageal reflux disease) No date: Hyperlipemia No date: Hypertension  Past Surgical History: No date: ABDOMINAL HYSTERECTOMY No date: APPENDECTOMY No date: CHOLECYSTECTOMY No date: HERNIA REPAIR No date: KNEE ARTHROSCOPY     Reproductive/Obstetrics negative OB ROS                            Anesthesia Physical Anesthesia Plan  ASA: II  Anesthesia Plan: General   Post-op Pain Management:    Induction:   PONV Risk Score and Plan: Propofol infusion and TIVA  Airway Management Planned: Natural Airway and Nasal Cannula  Additional Equipment:   Intra-op Plan:   Post-operative Plan:   Informed Consent: I have reviewed the patients History and Physical, chart, labs and discussed the procedure including the risks, benefits and alternatives for the proposed anesthesia with the patient or authorized representative who has indicated his/her understanding and acceptance.   Dental Advisory Given  Plan Discussed with: CRNA  Anesthesia Plan Comments:        Anesthesia Quick  Evaluation

## 2018-05-16 LAB — SURGICAL PATHOLOGY

## 2018-05-17 ENCOUNTER — Encounter: Payer: Self-pay | Admitting: Gastroenterology

## 2018-09-01 ENCOUNTER — Telehealth: Payer: Self-pay

## 2018-09-01 ENCOUNTER — Other Ambulatory Visit: Payer: Self-pay

## 2018-09-01 DIAGNOSIS — I10 Essential (primary) hypertension: Secondary | ICD-10-CM

## 2018-09-01 MED ORDER — LOSARTAN POTASSIUM-HCTZ 100-25 MG PO TABS: 1.0000 | ORAL_TABLET | Freq: Every day | ORAL | 4 refills | Status: DC

## 2018-09-01 NOTE — Telephone Encounter (Signed)
lmom pt need to been seen for further refills

## 2018-10-03 ENCOUNTER — Other Ambulatory Visit: Payer: Self-pay

## 2018-11-08 ENCOUNTER — Telehealth: Payer: Self-pay

## 2018-11-08 NOTE — Telephone Encounter (Signed)
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 She also informed pt that she would communicate this information to her provider, Heather.

## 2019-01-02 ENCOUNTER — Ambulatory Visit (INDEPENDENT_AMBULATORY_CARE_PROVIDER_SITE_OTHER): Payer: PPO | Admitting: Adult Health

## 2019-01-02 ENCOUNTER — Encounter: Payer: Self-pay | Admitting: Adult Health

## 2019-01-02 ENCOUNTER — Other Ambulatory Visit: Payer: Self-pay

## 2019-01-02 VITALS — BP 167/68 | HR 60 | Resp 16 | Ht 66.0 in | Wt 148.0 lb

## 2019-01-02 DIAGNOSIS — Z0001 Encounter for general adult medical examination with abnormal findings: Secondary | ICD-10-CM | POA: Diagnosis not present

## 2019-01-02 DIAGNOSIS — R300 Dysuria: Secondary | ICD-10-CM | POA: Diagnosis not present

## 2019-01-02 DIAGNOSIS — E782 Mixed hyperlipidemia: Secondary | ICD-10-CM

## 2019-01-02 DIAGNOSIS — I10 Essential (primary) hypertension: Secondary | ICD-10-CM | POA: Diagnosis not present

## 2019-01-02 DIAGNOSIS — K219 Gastro-esophageal reflux disease without esophagitis: Secondary | ICD-10-CM

## 2019-01-02 DIAGNOSIS — T7840XS Allergy, unspecified, sequela: Secondary | ICD-10-CM

## 2019-01-02 DIAGNOSIS — E559 Vitamin D deficiency, unspecified: Secondary | ICD-10-CM | POA: Diagnosis not present

## 2019-01-02 NOTE — Progress Notes (Signed)
[facility] [place], [place] [address]  Internal MEDICINE  Office Visit Note  Patient Name: Rachael Knapp  [id number]  [id number]  Date of Service: 01/02/2019  Chief Complaint  Patient presents with  . Medical Management of Chronic Issues  . Annual Exam    medicare annual wellness visit   . Hypertension  . Hyperlipidemia  . Allergic Rhinitis     constant runny nose      HPI Pt is here for routine health maintenance examination. She is a well appearing 78 yo woman.  She is still working part time, and overall doing very well.  She has a  History of HTN, HLD, GERD, and allergies.  She denies any new or worse symptoms at this time.  Denies tobacco, alcohol, or illicit drug use.     Current Medication: Outpatient Encounter Medications as of 01/02/2019  Medication Sig  . bisoprolol (ZEBETA) 5 MG tablet Take 1 tablet (5 mg total) by mouth daily.  . CHLORPHENIRAMINE-PHENYLEPHRINE PO Take by mouth.  . losartan-hydrochlorothiazide (HYZAAR) 100-25 MG tablet Take 1 tablet by mouth daily.  . simvastatin (ZOCOR) 20 MG tablet Take 1 tablet (20 mg total) by mouth daily at 6 PM.  . [DISCONTINUED] nitrofurantoin, macrocrystal-monohydrate, (MACROBID) 100 MG capsule Take 1 capsule (100 mg total) by mouth 2 (two) times daily. (Patient not taking: Reported on 01/02/2019)   No facility-administered encounter medications on file as of 01/02/2019.     Surgical History: Past Surgical History:  Procedure Laterality Date  . ABDOMINAL HYSTERECTOMY    . APPENDECTOMY    . CHOLECYSTECTOMY    . COLONOSCOPY WITH PROPOFOL N/A 05/15/2018   Procedure: COLONOSCOPY WITH PROPOFOL;  Surgeon: Lollie Sails, MD;  Location: [facility] ENDOSCOPY;  Service: Endoscopy;  Laterality: N/A;  . HERNIA REPAIR    . KNEE ARTHROSCOPY      Medical History: Past Medical History:  Diagnosis Date  . GERD (gastroesophageal reflux disease)   . Hyperlipemia   . Hypertension     Family  History: Family History  Problem Relation Age of Onset  . Breast cancer Neg Hx       Review of Systems  Constitutional: Negative for chills, fatigue and unexpected weight change.  HENT: Negative for congestion, rhinorrhea, sneezing and sore throat.   Eyes: Negative for photophobia, pain and redness.  Respiratory: Negative for cough, chest tightness and shortness of breath.   Cardiovascular: Negative for chest pain and palpitations.  Gastrointestinal: Negative for abdominal pain, constipation, diarrhea, nausea and vomiting.  Endocrine: Negative.   Genitourinary: Negative for dysuria and frequency.  Musculoskeletal: Negative for arthralgias, back pain, joint swelling and neck pain.  Skin: Negative for rash.  Allergic/Immunologic: Negative.   Neurological: Negative for tremors and numbness.  Hematological: Negative for adenopathy. Does not bruise/bleed easily.  Psychiatric/Behavioral: Negative for behavioral problems and sleep disturbance. The patient is not nervous/anxious.      Vital Signs: BP (!) 167/68 (BP Location: Left Arm, Patient Position: Sitting, Cuff Size: Normal)   Pulse 60   Resp 16   Ht 5\' 6"  (1.676 m)   Wt 148 lb (67.1 kg)   SpO2 100%   BMI 23.89 kg/m    Physical Exam Vitals signs and nursing note reviewed. Exam conducted with a chaperone present.  Constitutional:      General: She is not in acute distress.    Appearance: She is well-developed. She is not diaphoretic.  HENT:     Head: Normocephalic and atraumatic.  Mouth/Throat:     Pharynx: No oropharyngeal exudate.  Eyes:     Pupils: Pupils are equal, round, and reactive to light.  Neck:     Musculoskeletal: Normal range of motion and neck supple.     Thyroid: No thyromegaly.     Vascular: No JVD.     Trachea: No tracheal deviation.  Cardiovascular:     Rate and Rhythm: Normal rate and regular rhythm.     Heart sounds: Normal heart sounds. No murmur. No friction rub. No gallop.   Pulmonary:      Effort: Pulmonary effort is normal. No respiratory distress.     Breath sounds: Normal breath sounds. No wheezing or rales.  Chest:     Chest wall: No tenderness.     Breasts:        Right: Normal. No swelling, bleeding, inverted nipple, mass, nipple discharge, skin change or tenderness.        Left: Normal. No swelling, bleeding, inverted nipple, mass, nipple discharge, skin change or tenderness.     Comments: Exam Chaperoned by Dorna Bloom CMA Abdominal:     Palpations: Abdomen is soft.     Tenderness: There is no abdominal tenderness. There is no guarding.  Musculoskeletal: Normal range of motion.  Lymphadenopathy:     Cervical: No cervical adenopathy.  Skin:    General: Skin is warm and dry.  Neurological:     Mental Status: She is alert and oriented to person, place, and time.     Cranial Nerves: No cranial nerve deficit.  Psychiatric:        Behavior: Behavior normal.        Thought Content: Thought content normal.        Judgment: Judgment normal.      LABS: No results found for this or any previous visit (from the past 2160 hour(s)).   Assessment/Plan: 1. Encounter for general adult medical examination with abnormal findings Up to date on PHM.  - CBC with Differential/Platelet - Lipid Panel With LDL/HDL Ratio - TSH - T4, free - Comprehensive metabolic panel - Vitamin D 1,25 dihydroxy  2. Allergic state, sequela Stable, continue present therapy.   3. Gastroesophageal reflux disease without esophagitis Stable, pt reports using OTC tums as needed.   4. Essential hypertension Slightly elevated today 167/68, continue current therapy.   5. Mixed hyperlipidemia Will get lipid panel and review when available.   6. Vitamin D deficiency Will get Vit D level to evaluate, and treat accordingly.   7. Dysuria - UA/M w/rflx Culture, Routine  General Counseling: [name] understanding of the findings of todays visit and agrees with plan of treatment. I  have discussed any further diagnostic evaluation that may be needed or ordered today. We also reviewed her medications today. she has been encouraged to call the office with any questions or concerns that should arise related to todays visit.   Orders Placed This Encounter  Procedures  . UA/M w/rflx Culture, Routine    No orders of the defined types were placed in this encounter.   Time spent: 35  Minutes   This patient was seen by Orson Gear AGNP-C in Collaboration with Dr Lavera Guise as a part of collaborative care agreement    Kendell Bane AGNP-C Internal Medicine

## 2019-01-05 LAB — UA/M W/RFLX CULTURE, ROUTINE
Bilirubin, UA: NEGATIVE
Glucose, UA: NEGATIVE
Ketones, UA: NEGATIVE
Nitrite, UA: NEGATIVE
Protein,UA: NEGATIVE
RBC, UA: NEGATIVE
Specific Gravity, UA: 1.008 (ref 1.005–1.030)
Urobilinogen, Ur: 0.2 mg/dL (ref 0.2–1.0)
pH, UA: 6.5 (ref 5.0–7.5)

## 2019-01-05 LAB — URINE CULTURE, REFLEX

## 2019-01-05 LAB — MICROSCOPIC EXAMINATION
Bacteria, UA: NONE SEEN
Casts: NONE SEEN /lpf
Epithelial Cells (non renal): NONE SEEN /hpf (ref 0–10)

## 2019-01-09 ENCOUNTER — Other Ambulatory Visit: Payer: Self-pay

## 2019-01-09 DIAGNOSIS — I10 Essential (primary) hypertension: Secondary | ICD-10-CM

## 2019-01-09 MED ORDER — LOSARTAN POTASSIUM-HCTZ 100-25 MG PO TABS: 1.0000 | ORAL_TABLET | Freq: Every day | ORAL | 4 refills | Status: DC

## 2019-01-11 DIAGNOSIS — Z0001 Encounter for general adult medical examination with abnormal findings: Secondary | ICD-10-CM | POA: Diagnosis not present

## 2019-01-12 LAB — COMPREHENSIVE METABOLIC PANEL
ALT: 16 IU/L (ref 0–32)
AST: 18 IU/L (ref 0–40)
Albumin/Globulin Ratio: 1.5 (ref 1.2–2.2)
Albumin: 4.3 g/dL (ref 3.7–4.7)
Alkaline Phosphatase: 104 IU/L (ref 39–117)
BUN/Creatinine Ratio: 12 (ref 12–28)
BUN: 11 mg/dL (ref 8–27)
Bilirubin Total: 0.3 mg/dL (ref 0.0–1.2)
CO2: 26 mmol/L (ref 20–29)
Calcium: 9.4 mg/dL (ref 8.7–10.3)
Chloride: 98 mmol/L (ref 96–106)
Creatinine, Ser: 0.94 mg/dL (ref 0.57–1.00)
GFR calc Af Amer: 67 mL/min/{1.73_m2} (ref >59)
GFR calc non Af Amer: 58 mL/min/{1.73_m2} — ABNORMAL LOW (ref >59)
Globulin, Total: 2.8 g/dL (ref 1.5–4.5)
Glucose: 100 mg/dL — ABNORMAL HIGH (ref 65–99)
Potassium: 4 mmol/L (ref 3.5–5.2)
Sodium: 138 mmol/L (ref 134–144)
Total Protein: 7.1 g/dL (ref 6.0–8.5)

## 2019-01-12 LAB — CBC WITH DIFFERENTIAL/PLATELET
Basophils Absolute: 0 10*3/uL (ref 0.0–0.2)
Basos: 1 %
EOS (ABSOLUTE): 0.1 10*3/uL (ref 0.0–0.4)
Eos: 1 %
Hematocrit: 41 % (ref 34.0–46.6)
Hemoglobin: 13.4 g/dL (ref 11.1–15.9)
Immature Grans (Abs): 0 10*3/uL (ref 0.0–0.1)
Immature Granulocytes: 0 %
Lymphocytes Absolute: 1.2 10*3/uL (ref 0.7–3.1)
Lymphs: 29 %
MCH: 29.9 pg (ref 26.6–33.0)
MCHC: 32.7 g/dL (ref 31.5–35.7)
MCV: 92 fL (ref 79–97)
Monocytes Absolute: 0.3 10*3/uL (ref 0.1–0.9)
Monocytes: 7 %
Neutrophils Absolute: 2.6 10*3/uL (ref 1.4–7.0)
Neutrophils: 62 %
Platelets: 143 10*3/uL — ABNORMAL LOW (ref 150–450)
RBC: 4.48 x10E6/uL (ref 3.77–5.28)
RDW: 12.4 % (ref 11.7–15.4)
WBC: 4.2 10*3/uL (ref 3.4–10.8)

## 2019-01-12 LAB — LIPID PANEL WITH LDL/HDL RATIO
Cholesterol, Total: 229 mg/dL — ABNORMAL HIGH (ref 100–199)
HDL: 57 mg/dL (ref >39)
LDL Chol Calc (NIH): 149 mg/dL — ABNORMAL HIGH (ref 0–99)
LDL/HDL Ratio: 2.6 ratio (ref 0.0–3.2)
Triglycerides: 129 mg/dL (ref 0–149)
VLDL Cholesterol Cal: 23 mg/dL (ref 5–40)

## 2019-01-12 LAB — TSH: TSH: 3.12 u[IU]/mL (ref 0.450–4.500)

## 2019-01-12 LAB — T4, FREE: Free T4: 0.95 ng/dL (ref 0.82–1.77)

## 2019-01-24 ENCOUNTER — Ambulatory Visit: Payer: PPO | Admitting: Adult Health

## 2019-02-07 ENCOUNTER — Encounter: Payer: Self-pay | Admitting: Adult Health

## 2019-02-07 ENCOUNTER — Ambulatory Visit (INDEPENDENT_AMBULATORY_CARE_PROVIDER_SITE_OTHER): Payer: PPO | Admitting: Adult Health

## 2019-02-07 ENCOUNTER — Other Ambulatory Visit: Payer: Self-pay

## 2019-02-07 VITALS — BP 150/79 | HR 60 | Temp 97.5°F | Resp 16 | Ht 66.0 in | Wt 158.0 lb

## 2019-02-07 DIAGNOSIS — D691 Qualitative platelet defects: Secondary | ICD-10-CM | POA: Diagnosis not present

## 2019-02-07 DIAGNOSIS — I10 Essential (primary) hypertension: Secondary | ICD-10-CM | POA: Diagnosis not present

## 2019-02-07 DIAGNOSIS — K219 Gastro-esophageal reflux disease without esophagitis: Secondary | ICD-10-CM

## 2019-02-07 DIAGNOSIS — E782 Mixed hyperlipidemia: Secondary | ICD-10-CM | POA: Diagnosis not present

## 2019-02-07 MED ORDER — LANSOPRAZOLE 30 MG PO CPDR: 30.0000 mg | DELAYED_RELEASE_CAPSULE | Freq: Every day | ORAL | 0 refills | Status: DC

## 2019-02-07 NOTE — Progress Notes (Signed)
[facility] [place], [place] [address]  Internal MEDICINE  Office Visit Note  Patient Name: Rachael Knapp  [id number]  [id number]  Date of Service: 02/07/2019  Chief Complaint  Patient presents with  . Follow-up    review labs    HPI  Pt is here for follow up and to review labs.  She reports that overall she is doing well.  Her labs show an elevated cholesterol level and she reports she sometimes forgets to take her medications.  Also her platelets are lower than the recommended level. In the past she has had this issue and she was worked up by the cancer center/hematology and no diagnosis was every given to her.    Current Medication: Outpatient Encounter Medications as of 02/07/2019  Medication Sig  . bisoprolol (ZEBETA) 5 MG tablet Take 1 tablet (5 mg total) by mouth daily.  . CHLORPHENIRAMINE-PHENYLEPHRINE PO Take by mouth.  . losartan-hydrochlorothiazide (HYZAAR) 100-25 MG tablet Take 1 tablet by mouth daily.  . simvastatin (ZOCOR) 20 MG tablet Take 1 tablet (20 mg total) by mouth daily at 6 PM.  . lansoprazole (PREVACID) 30 MG capsule Take 1 capsule (30 mg total) by mouth daily at 12 noon.   No facility-administered encounter medications on file as of 02/07/2019.     Surgical History: Past Surgical History:  Procedure Laterality Date  . ABDOMINAL HYSTERECTOMY    . APPENDECTOMY    . CHOLECYSTECTOMY    . COLONOSCOPY WITH PROPOFOL N/A 05/15/2018   Procedure: COLONOSCOPY WITH PROPOFOL;  Surgeon: Lollie Sails, MD;  Location: [facility] ENDOSCOPY;  Service: Endoscopy;  Laterality: N/A;  . HERNIA REPAIR    . KNEE ARTHROSCOPY      Medical History: Past Medical History:  Diagnosis Date  . GERD (gastroesophageal reflux disease)   . Hyperlipemia   . Hypertension     Family History: Family History  Problem Relation Age of Onset  . Breast cancer Neg Hx     Social History   Socioeconomic History  . Marital status: Widowed     Spouse name: Not on file  . Number of children: Not on file  . Years of education: Not on file  . Highest education level: Not on file  Occupational History  . Not on file  Social Needs  . Financial resource strain: Not on file  . Food insecurity    Worry: Not on file    Inability: Not on file  . Transportation needs    Medical: Not on file    Non-medical: Not on file  Tobacco Use  . Smoking status: Never Smoker  . Smokeless tobacco: Never Used  Substance and Sexual Activity  . Alcohol use: No  . Drug use: Never  . Sexual activity: Not on file  Lifestyle  . Physical activity    Days per week: Not on file    Minutes per session: Not on file  . Stress: Not on file  Relationships  . Social Herbalist on phone: Not on file    Gets together: Not on file    Attends religious service: Not on file    Active member of club or organization: Not on file    Attends meetings of clubs or organizations: Not on file    Relationship status: Not on file  . Intimate partner violence    Fear of current or ex partner: Not on file    Emotionally abused: Not on file  Physically abused: Not on file    Forced sexual activity: Not on file  Other Topics Concern  . Not on file  Social History Narrative  . Not on file      Review of Systems  Constitutional: Negative for chills, fatigue and unexpected weight change.  HENT: Negative for congestion, rhinorrhea, sneezing and sore throat.   Eyes: Negative for photophobia, pain and redness.  Respiratory: Negative for cough, chest tightness and shortness of breath.   Cardiovascular: Negative for chest pain and palpitations.  Gastrointestinal: Negative for abdominal pain, constipation, diarrhea, nausea and vomiting.  Endocrine: Negative.   Genitourinary: Negative for dysuria and frequency.  Musculoskeletal: Negative for arthralgias, back pain, joint swelling and neck pain.  Skin: Negative for rash.  Allergic/Immunologic: Negative.    Neurological: Negative for tremors and numbness.  Hematological: Negative for adenopathy. Does not bruise/bleed easily.  Psychiatric/Behavioral: Negative for behavioral problems and sleep disturbance. The patient is not nervous/anxious.     Vital Signs: BP (!) 150/79   Pulse 60   Temp (!) 97.5 F (36.4 C)   Resp 16   Ht 5\' 6"  (1.676 m)   Wt 158 lb (71.7 kg)   SpO2 99%   BMI 25.50 kg/m    Physical Exam Vitals signs and nursing note reviewed.  Constitutional:      General: She is not in acute distress.    Appearance: She is well-developed. She is not diaphoretic.  HENT:     Head: Normocephalic and atraumatic.     Mouth/Throat:     Pharynx: No oropharyngeal exudate.  Eyes:     Pupils: Pupils are equal, round, and reactive to light.  Neck:     Musculoskeletal: Normal range of motion and neck supple.     Thyroid: No thyromegaly.     Vascular: No JVD.     Trachea: No tracheal deviation.  Cardiovascular:     Rate and Rhythm: Normal rate and regular rhythm.     Heart sounds: Normal heart sounds. No murmur. No friction rub. No gallop.   Pulmonary:     Effort: Pulmonary effort is normal. No respiratory distress.     Breath sounds: Normal breath sounds. No wheezing or rales.  Chest:     Chest wall: No tenderness.  Abdominal:     Palpations: Abdomen is soft.     Tenderness: There is no abdominal tenderness. There is no guarding.  Musculoskeletal: Normal range of motion.  Lymphadenopathy:     Cervical: No cervical adenopathy.  Skin:    General: Skin is warm and dry.  Neurological:     Mental Status: She is alert and oriented to person, place, and time.     Cranial Nerves: No cranial nerve deficit.  Psychiatric:        Behavior: Behavior normal.        Thought Content: Thought content normal.        Judgment: Judgment normal.    Assessment/Plan: 1. Essential hypertension Slightly elevated today, 150/79, continue to take medications as directed, continue to monitor.    2. Mixed hyperlipidemia Discussed lipid panel, continue current therapy.   3. Gastroesophageal reflux disease without esophagitis Take prevacid as discussed.  - lansoprazole (PREVACID) 30 MG capsule; Take 1 capsule (30 mg total) by mouth daily at 12 noon.  Dispense: 20 capsule; Refill: 0  4. Platelet dysfunction (HCC) Will recheck labs and follow up with patient as discussed.  General Counseling: [name] understanding of the findings of todays visit and agrees  with plan of treatment. I have discussed any further diagnostic evaluation that may be needed or ordered today. We also reviewed her medications today. she has been encouraged to call the office with any questions or concerns that should arise related to todays visit.    No orders of the defined types were placed in this encounter.   Meds ordered this encounter  Medications  . lansoprazole (PREVACID) 30 MG capsule    Sig: Take 1 capsule (30 mg total) by mouth daily at 12 noon.    Dispense:  20 capsule    Refill:  0    Time spent: 15 Minutes   This patient was seen by Orson Gear AGNP-C in Collaboration with Dr Lavera Guise as a part of collaborative care agreement     Kendell Bane AGNP-C Internal medicine

## 2019-02-16 ENCOUNTER — Other Ambulatory Visit: Payer: Self-pay | Admitting: Adult Health

## 2019-02-16 DIAGNOSIS — Z1231 Encounter for screening mammogram for malignant neoplasm of breast: Secondary | ICD-10-CM

## 2019-02-26 ENCOUNTER — Other Ambulatory Visit: Payer: Self-pay

## 2019-02-26 DIAGNOSIS — E782 Mixed hyperlipidemia: Secondary | ICD-10-CM

## 2019-02-26 MED ORDER — BISOPROLOL FUMARATE 5 MG PO TABS: 5.0000 mg | ORAL_TABLET | Freq: Every day | ORAL | 5 refills | Status: DC

## 2019-03-14 ENCOUNTER — Ambulatory Visit
Admission: RE | Admit: 2019-03-14 | Discharge: 2019-03-14 | Disposition: A | Discharge status: Home / Self Care | Payer: PPO | Source: Ambulatory Visit | Attending: Adult Health | Admitting: Adult Health

## 2019-03-14 DIAGNOSIS — Z1231 Encounter for screening mammogram for malignant neoplasm of breast: Secondary | ICD-10-CM | POA: Diagnosis not present

## 2019-06-27 ENCOUNTER — Other Ambulatory Visit: Payer: Self-pay | Admitting: Adult Health

## 2019-06-27 DIAGNOSIS — I10 Essential (primary) hypertension: Secondary | ICD-10-CM

## 2019-11-02 ENCOUNTER — Telehealth: Payer: Self-pay | Admitting: Nurse Practitioner

## 2019-11-02 ENCOUNTER — Other Ambulatory Visit: Payer: Self-pay | Admitting: Nurse Practitioner

## 2019-11-02 DIAGNOSIS — J0140 Acute pansinusitis, unspecified: Secondary | ICD-10-CM

## 2019-11-02 MED ORDER — AZITHROMYCIN 250 MG PO TABS: ORAL_TABLET | ORAL | 0 refills | Status: DC

## 2019-11-02 NOTE — Telephone Encounter (Signed)
Prescription for z-pack sent [facility]. Take as directed for 5 days. She should rest when possible and take over the counter medications as needed and as indicated to help acute symptoms.

## 2019-11-02 NOTE — Progress Notes (Signed)
Prescription for z-pack sent to [facility]. Take as directed for 5 days. She should rest when possible and take over the counter medications as needed and as indicated to help acute symptoms.

## 2020-01-01 ENCOUNTER — Telehealth: Payer: Self-pay

## 2020-01-01 NOTE — Telephone Encounter (Signed)
Lmom to confirm and screen for 01-03-20 ov.

## 2020-01-03 ENCOUNTER — Ambulatory Visit (INDEPENDENT_AMBULATORY_CARE_PROVIDER_SITE_OTHER): Payer: PPO | Admitting: Nurse Practitioner

## 2020-01-03 ENCOUNTER — Other Ambulatory Visit: Payer: Self-pay

## 2020-01-03 ENCOUNTER — Encounter: Payer: Self-pay | Admitting: Nurse Practitioner

## 2020-01-03 VITALS — BP 152/86 | HR 65 | Temp 97.6°F | Resp 16 | Ht 66.0 in | Wt 161.0 lb

## 2020-01-03 DIAGNOSIS — I10 Essential (primary) hypertension: Secondary | ICD-10-CM

## 2020-01-03 DIAGNOSIS — M1711 Unilateral primary osteoarthritis, right knee: Secondary | ICD-10-CM

## 2020-01-03 DIAGNOSIS — Z23 Encounter for immunization: Secondary | ICD-10-CM | POA: Diagnosis not present

## 2020-01-03 DIAGNOSIS — Z1231 Encounter for screening mammogram for malignant neoplasm of breast: Secondary | ICD-10-CM | POA: Diagnosis not present

## 2020-01-03 DIAGNOSIS — R300 Dysuria: Secondary | ICD-10-CM | POA: Diagnosis not present

## 2020-01-03 DIAGNOSIS — E782 Mixed hyperlipidemia: Secondary | ICD-10-CM | POA: Diagnosis not present

## 2020-01-03 DIAGNOSIS — Z0001 Encounter for general adult medical examination with abnormal findings: Secondary | ICD-10-CM | POA: Diagnosis not present

## 2020-01-03 MED ORDER — SIMVASTATIN 20 MG PO TABS: 20.0000 mg | ORAL_TABLET | Freq: Every day | ORAL | 3 refills | Status: DC

## 2020-01-03 MED ORDER — DICLOFENAC SODIUM 1 % EX GEL: 4.0000 g | Freq: Four times a day (QID) | CUTANEOUS | 2 refills | Status: AC

## 2020-01-03 NOTE — Progress Notes (Signed)
[facility] [place], [place] [address]  Internal MEDICINE  Office Visit Note  Patient Name: Rachael Knapp  [id number]  [id number]  Date of Service: 01/22/2020   Pt is here for routine health maintenance examination   Chief Complaint  Patient presents with  . Medicare Wellness  . Gastroesophageal Reflux  . Hyperlipidemia  . Hypertension  . Quality Metric Gaps  . Medication Refill    simvastatin     The patient presents for a health maintenance exam today. Blood pressure is moderately elevated today. She denies chest pain, chest pressure, shortness of breath, or headache. Blood pressure did return to near normal by end of visit. She does see cardiology on routine basis. She is due to have routine, fasting labs. She will need to have screening mammogram in 03/2020. She has no physical concerns or complaints today.   Current Medication: Outpatient Encounter Medications as of 01/03/2020  Medication Sig  . bisoprolol (ZEBETA) 5 MG tablet Take 1 tablet (5 mg total) by mouth daily.  . CHLORPHENIRAMINE-PHENYLEPHRINE PO Take by mouth.  . lansoprazole (PREVACID) 30 MG capsule Take 1 capsule (30 mg total) by mouth daily at 12 noon.  . simvastatin (ZOCOR) 20 MG tablet Take 1 tablet (20 mg total) by mouth daily at 6 PM.  . [DISCONTINUED] azithromycin (ZITHROMAX) 250 MG tablet Take one tab po qd  . [DISCONTINUED] losartan-hydrochlorothiazide (HYZAAR) 100-25 MG tablet TAKE ONE TABLET EVERY DAY  . [DISCONTINUED] simvastatin (ZOCOR) 20 MG tablet Take 1 tablet (20 mg total) by mouth daily at 6 PM.  . diclofenac Sodium (VOLTAREN) 1 % GEL Apply 4 g topically 4 (four) times daily.   No facility-administered encounter medications on file as of 01/03/2020.    Surgical History: Past Surgical History:  Procedure Laterality Date  . ABDOMINAL HYSTERECTOMY    . APPENDECTOMY    . CHOLECYSTECTOMY    . COLONOSCOPY WITH PROPOFOL N/A 05/15/2018   Procedure: COLONOSCOPY  WITH PROPOFOL;  Surgeon: Lollie Sails, MD;  Location: [facility] ENDOSCOPY;  Service: Endoscopy;  Laterality: N/A;  . HERNIA REPAIR    . KNEE ARTHROSCOPY      Medical History: Past Medical History:  Diagnosis Date  . GERD (gastroesophageal reflux disease)   . Hyperlipemia   . Hypertension     Family History: Family History  Problem Relation Age of Onset  . Breast cancer Neg Hx       Review of Systems  Constitutional: Negative for activity change, chills, fatigue and unexpected weight change.  HENT: Negative for congestion, postnasal drip, rhinorrhea, sneezing and sore throat.   Respiratory: Negative for cough, chest tightness and shortness of breath.   Cardiovascular: Negative for chest pain and palpitations.       Blood pressure is mildly elevated today.   Gastrointestinal: Negative for abdominal pain, constipation, diarrhea, nausea and vomiting.  Endocrine: Negative for cold intolerance, heat intolerance, polydipsia and polyuria.  Genitourinary: Negative for dysuria, frequency and urgency.  Musculoskeletal: Negative for arthralgias, back pain, joint swelling and neck pain.  Skin: Negative for rash.  Allergic/Immunologic: Negative for environmental allergies.  Neurological: Negative for dizziness, tremors, numbness and headaches.  Hematological: Negative for adenopathy. Does not bruise/bleed easily.  Psychiatric/Behavioral: Negative for behavioral problems (Depression), sleep disturbance and suicidal ideas. The patient is not nervous/anxious.     Today's Vitals   01/03/20 1510  BP: (!) 152/86  Pulse: 65  Resp: 16  Temp: 97.6 F (36.4 C)  SpO2: 97%  Weight: 161 lb (  73 kg)  Height: 5\' 6"  (1.676 m)   Body mass index is 25.99 kg/m.  Physical Exam Vitals and nursing note reviewed.  Constitutional:      General: She is not in acute distress.    Appearance: Normal appearance. She is well-developed. She is not diaphoretic.  HENT:     Head: Normocephalic and  atraumatic.     Nose: Nose normal.     Mouth/Throat:     Pharynx: No oropharyngeal exudate.  Eyes:     Pupils: Pupils are equal, round, and reactive to light.  Neck:     Thyroid: No thyromegaly.     Vascular: No carotid bruit or JVD.     Trachea: No tracheal deviation.  Cardiovascular:     Rate and Rhythm: Normal rate and regular rhythm.     Pulses: Normal pulses.     Heart sounds: No friction rub. No gallop.   Pulmonary:     Effort: Pulmonary effort is normal. No respiratory distress.     Breath sounds: Normal breath sounds. No wheezing or rales.  Chest:     Chest wall: No tenderness.  Abdominal:     General: Bowel sounds are normal.     Palpations: Abdomen is soft.     Tenderness: There is no abdominal tenderness.  Musculoskeletal:        General: Normal range of motion.     Cervical back: Normal range of motion and neck supple.  Lymphadenopathy:     Cervical: No cervical adenopathy.  Skin:    General: Skin is warm and dry.  Neurological:     General: No focal deficit present.     Mental Status: She is alert and oriented to person, place, and time.     Cranial Nerves: No cranial nerve deficit.  Psychiatric:        Mood and Affect: Mood normal.        Behavior: Behavior normal.        Thought Content: Thought content normal.        Judgment: Judgment normal.    Depression screen [facility] 2/9 01/03/2020 01/02/2019 12/30/2017 08/10/2017 07/14/2017  Decreased Interest 0 0 0 0 0  Down, Depressed, Hopeless 0 0 0 0 0  PHQ - 2 Score 0 0 0 0 0    Functional Status Survey: Is the patient deaf or have difficulty hearing?: Yes Does the patient have difficulty seeing, even when wearing glasses/contacts?: Yes Does the patient have difficulty concentrating, remembering, or making decisions?: Yes Does the patient have difficulty walking or climbing stairs?: No Does the patient have difficulty dressing or bathing?: No Does the patient have difficulty doing errands alone such as visiting a  doctor's office or shopping?: No  MMSE - [place] Exam 01/03/2020 01/02/2019 12/30/2017  Orientation to time 5 5 5   Orientation to Place 5 5 5   Registration 3 3 3   Attention/ Calculation 5 5 5   Recall 3 3 3   Language- name 2 objects 2 2 2   Language- repeat 1 1 1   Language- follow 3 step command 3 3 3   Language- read & follow direction 1 1 1   Write a sentence 1 1 1   Copy design 1 1 1   Total score 30 30 30     Fall Risk  01/03/2020 01/02/2019 12/30/2017 08/10/2017 07/14/2017  Falls in the past year? 0 0 No No No  Number falls in past yr: - 0 - - -  Injury with Fall? - 0 - - -  LABS: Recent Results (from the past 2160 hour(s))  UA/M w/rflx Culture, Routine     Status: Abnormal   Collection Time: 01/03/20  3:13 PM   Specimen: Urine   Urine  Result Value Ref Range   Specific Gravity, UA 1.005 1.005 - 1.030   pH, UA 7.0 5.0 - 7.5   Color, UA Yellow Yellow   Appearance Ur Clear Clear   Leukocytes,UA Trace (A) Negative   Protein,UA Negative Negative/Trace   Glucose, UA Negative Negative   Ketones, UA Negative Negative   RBC, UA Negative Negative   Bilirubin, UA Negative Negative   Urobilinogen, Ur 0.2 0.2 - 1.0 mg/dL   Nitrite, UA Negative Negative   Microscopic Examination See below:     Comment: Microscopic was indicated and was performed.   Urinalysis Reflex Comment     Comment: This specimen has reflexed to a Urine Culture.  Microscopic Examination     Status: None   Collection Time: 01/03/20  3:13 PM   Urine  Result Value Ref Range   WBC, UA 0-5 0 - 5 /hpf   RBC None seen 0 - 2 /hpf   Epithelial Cells (non renal) None seen 0 - 10 /hpf   Casts None seen None seen /lpf   Bacteria, UA None seen None seen/Few  Urine Culture, Reflex     Status: Abnormal   Collection Time: 01/03/20  3:13 PM   Urine  Result Value Ref Range   Urine Culture, Routine Final report (A)    Organism ID, Bacteria Enterococcus faecalis (A)     Comment: 50,000-100,000 colony forming units  per mL   ORGANISM ID, BACTERIA Comment     Comment: Mixed urogenital flora 10,000-25,000 colony forming units per mL    Antimicrobial Susceptibility Comment     Comment:       ** S = Susceptible; I = Intermediate; R = Resistant **                    P = Positive; N = Negative             MICS are expressed in micrograms per mL    Antibiotic                 RSLT#1    RSLT#2    RSLT#3    RSLT#4 Ciprofloxacin                  S Levofloxacin                   S Nitrofurantoin                 S Penicillin                     S Tetracycline                   R Vancomycin                     S   Comprehensive metabolic panel     Status: Abnormal   Collection Time: 01/10/20  8:30 AM  Result Value Ref Range   Glucose 98 65 - 99 mg/dL   BUN 11 8 - 27 mg/dL   Creatinine, Ser 0.97 0.57 - 1.00 mg/dL   GFR calc non Af Amer 56 (L) >59 mL/min/1.73   GFR calc Af Amer 64 >59 mL/min/1.73    Comment: **[facility] currently  reports eGFR in compliance with the current**   recommendations of the [employer]. [facility] will   update reporting as new guidelines are published from the NKF-ASN   Task force.    BUN/Creatinine Ratio 11 (L) 12 - 28   Sodium 140 134 - 144 mmol/L   Potassium 4.2 3.5 - 5.2 mmol/L   Chloride 100 96 - 106 mmol/L   CO2 26 20 - 29 mmol/L   Calcium 9.6 8.7 - 10.3 mg/dL   Total Protein 7.3 6.0 - 8.5 g/dL   Albumin 4.4 3.7 - 4.7 g/dL   Globulin, Total 2.9 1.5 - 4.5 g/dL   Albumin/Globulin Ratio 1.5 1.2 - 2.2   Bilirubin Total 0.4 0.0 - 1.2 mg/dL   Alkaline Phosphatase 109 48 - 121 IU/L    Comment: **Effective January 14, 2020 Alkaline Phosphatase**   reference interval will be changing to:              Age                Female          Female           0 -  5 days         69 - 127       81 - 127           6 - 10 days         48 - 242       4 - 14          11 - 20 days        109 - 357      109 - 357          21 - 30 days         94 - 220       25 - 494            1 -  2 months      149 - 539      149 - 539           3 -  6 months      131 - 452      131 - 452           7 - 11 months      117 - 401      117 - 401   12 months -  6 years       158 - 369      158 - 369           7 - 12 years       150 - 409      150 - 409               13 years       22 - 435       37 - 227               14 years       78 - 375       96 - 161               78 years        83 - 279       63 - 21               16 years  74 - 207       51 - 121               17 years        78 - 161       3 - 113          41 - 20 years        68 - 125       69 - 106              >20 years         40 - 121       44 - 121    AST 25 0 - 40 IU/L   ALT 16 0 - 32 IU/L  CBC     Status: Abnormal   Collection Time: 01/10/20  8:30 AM  Result Value Ref Range   WBC 3.9 3.4 - 10.8 x10E3/uL   RBC 4.38 3.77 - 5.28 x10E6/uL   Hemoglobin 13.3 11.1 - 15.9 g/dL   Hematocrit 38.7 34.0 - 46.6 %   MCV 88 79 - 97 fL   MCH 30.4 26.6 - 33.0 pg   MCHC 34.4 31 - 35 g/dL   RDW 12.4 11.7 - 15.4 %   Platelets 148 (L) 150 - 450 x10E3/uL  Lipid Panel With LDL/HDL Ratio     Status: Abnormal   Collection Time: 01/10/20  8:30 AM  Result Value Ref Range   Cholesterol, Total 215 (H) 100 - 199 mg/dL   Triglycerides 145 0 - 149 mg/dL   HDL 50 >39 mg/dL   VLDL Cholesterol Cal 26 5 - 40 mg/dL   LDL Chol Calc (NIH) 139 (H) 0 - 99 mg/dL   LDL/HDL Ratio 2.8 0.0 - 3.2 ratio    Comment:                                     LDL/HDL Ratio                                             Men  Women                               1/2 Avg.Risk  1.0    1.5                                   Avg.Risk  3.6    3.2                                2X Avg.Risk  6.2    5.0                                3X Avg.Risk  8.0    6.1   T4, free     Status: None   Collection Time: 01/10/20  8:30 AM  Result Value Ref Range   Free T4 0.93 0.82 - 1.77 ng/dL  TSH     Status: None   Collection Time: 01/10/20  8:30 AM  Result Value Ref  Range   TSH 3.040 0.450 - 4.500 uIU/mL  VITAMIN D 25 Hydroxy (Vit-D Deficiency, Fractures)     Status: Abnormal   Collection Time: 01/10/20  8:30 AM  Result Value Ref Range   Vit D, 25-Hydroxy 22.9 (L) 30.0 - 100.0 ng/mL    Comment: Vitamin D deficiency has been defined by the [place] practice guideline as a level of serum 25-OH vitamin D less than 20 ng/mL (1,2). The Endocrine Society went on to further define vitamin D insufficiency as a level between 21 and 29 ng/mL (2). 1. IOM (Institute of Medicine). 2010. Dietary reference    intakes for calcium and D. [place]: The    [employer]. 2. Holick MF, Binkley [place], Bischoff-Ferrari HA, et al.    Evaluation, treatment, and prevention of vitamin D    deficiency: an Endocrine Society clinical practice    guideline. JCEM. 2011 Jul; 96(7):1911-30.     Assessment/Plan: 1. Encounter for general adult medical examination with abnormal findings Annual health maintenance exam today. Order slip given to have routine, fasting labs drawn.   2. Essential hypertension Generally stable. Continue bp medication as prescribed. Regular visits with cardiology as scheduled.   3. Mixed hyperlipidemia Labs ordered to check fasting lipid panel. Adjust simvastatin dosing as indicated.  - simvastatin (ZOCOR) 20 MG tablet; Take 1 tablet (20 mg total) by mouth daily at 6 PM.  Dispense: 90 tablet; Refill: 3  4. Primary osteoarthritis of right knee May apply voltaren gel up to four times daily as needed for joint pain.  - diclofenac Sodium (VOLTAREN) 1 % GEL; Apply 4 g topically 4 (four) times daily.  Dispense: 100 g; Refill: 2  5. Encounter for screening mammogram for malignant neoplasm of breast - MM DIGITAL SCREENING BILATERAL; Future  6. Flu vaccine need Flu vaccine administered in the office today.  - Flu Vaccine MDCK QUAD PF  7. Dysuria - UA/M w/rflx Culture, Routine  General Counseling:  [name] understanding of the findings of todays visit and agrees with plan of treatment. I have discussed any further diagnostic evaluation that may be needed or ordered today. We also reviewed her medications today. she has been encouraged to call the office with any questions or concerns that should arise related to todays visit.    Counseling:  This patient was seen by [name] FNP Collaboration with Dr Lavera Guise as a part of collaborative care agreement  Orders Placed This Encounter  Procedures  . Microscopic Examination  . Urine Culture, Reflex  . MM DIGITAL SCREENING BILATERAL  . Flu Vaccine MDCK QUAD PF  . UA/M w/rflx Culture, Routine    Meds ordered this encounter  Medications  . diclofenac Sodium (VOLTAREN) 1 % GEL    Sig: Apply 4 g topically 4 (four) times daily.    Dispense:  100 g    Refill:  2    Order Specific Question:   Supervising Provider    Answer:   Lavera Guise [[id number]]  . simvastatin (ZOCOR) 20 MG tablet    Sig: Take 1 tablet (20 mg total) by mouth daily at 6 PM.    Dispense:  90 tablet    Refill:  3    Order Specific Question:   Supervising Provider    Answer:   Lavera Guise [[id number]]    Total time spent: 10 Minutes  Time spent includes review of chart, medications, test results, and follow  up plan with the patient.     Lavera Guise, MD  Internal Medicine

## 2020-01-07 LAB — UA/M W/RFLX CULTURE, ROUTINE
Bilirubin, UA: NEGATIVE
Glucose, UA: NEGATIVE
Ketones, UA: NEGATIVE
Nitrite, UA: NEGATIVE
Protein,UA: NEGATIVE
RBC, UA: NEGATIVE
Specific Gravity, UA: 1.005 (ref 1.005–1.030)
Urobilinogen, Ur: 0.2 mg/dL (ref 0.2–1.0)
pH, UA: 7 (ref 5.0–7.5)

## 2020-01-07 LAB — URINE CULTURE, REFLEX

## 2020-01-07 LAB — MICROSCOPIC EXAMINATION
Bacteria, UA: NONE SEEN
Casts: NONE SEEN /lpf
Epithelial Cells (non renal): NONE SEEN /hpf (ref 0–10)
RBC, Urine: NONE SEEN /hpf (ref 0–2)

## 2020-01-07 NOTE — Progress Notes (Signed)
Please call the pt and find out if she is having any dysuria or frequency of urine

## 2020-01-08 NOTE — Progress Notes (Signed)
LMOM for pt to call back.

## 2020-01-10 ENCOUNTER — Other Ambulatory Visit: Payer: Self-pay | Admitting: Nurse Practitioner

## 2020-01-10 DIAGNOSIS — E782 Mixed hyperlipidemia: Secondary | ICD-10-CM | POA: Diagnosis not present

## 2020-01-10 DIAGNOSIS — E559 Vitamin D deficiency, unspecified: Secondary | ICD-10-CM | POA: Diagnosis not present

## 2020-01-10 DIAGNOSIS — Z0001 Encounter for general adult medical examination with abnormal findings: Secondary | ICD-10-CM | POA: Diagnosis not present

## 2020-01-10 DIAGNOSIS — I10 Essential (primary) hypertension: Secondary | ICD-10-CM | POA: Diagnosis not present

## 2020-01-10 IMAGING — MG DIGITAL SCREENING BILAT W/ TOMO W/ CAD
8 series · 8 of 24 positions shown · non-contrast
Comparison: Previous exam(s).

CLINICAL DATA: Screening.

EXAM:
DIGITAL SCREENING BILATERAL MAMMOGRAM WITH TOMO AND CAD

[L CC synth-2D]
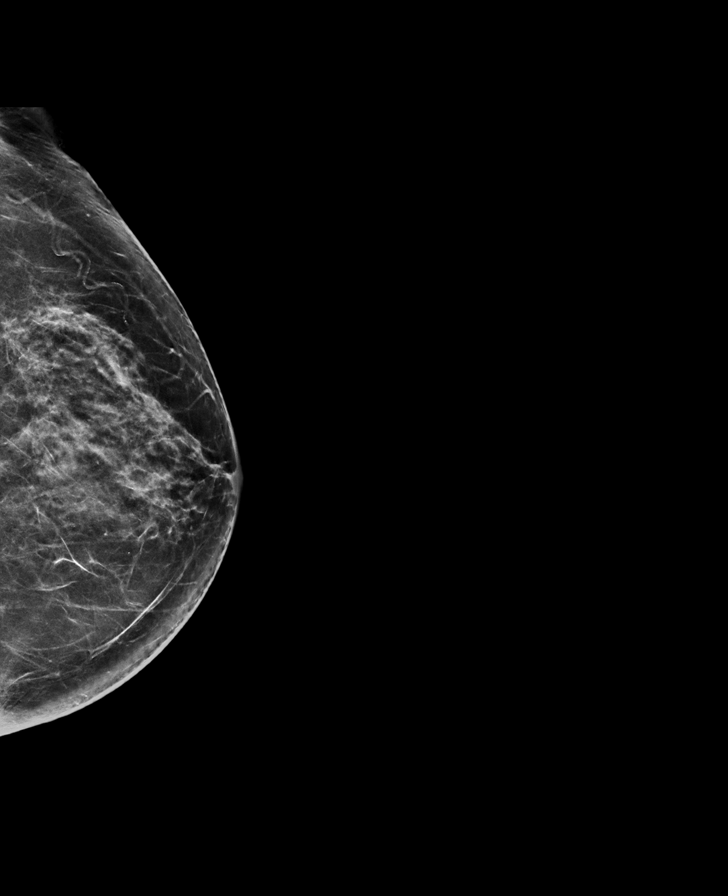

[R CC synth-2D]
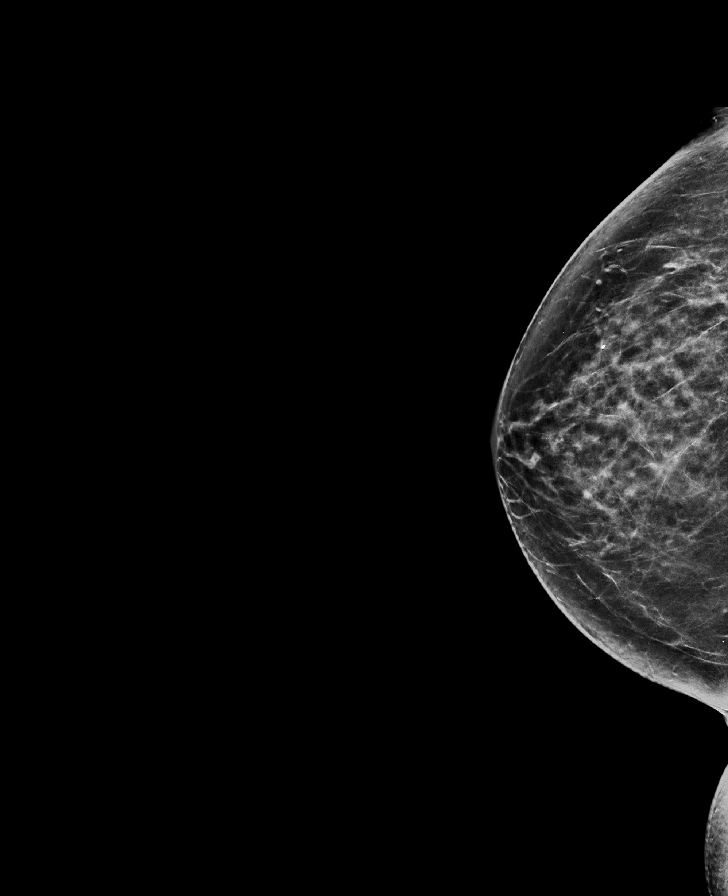

[L MLO synth-2D]
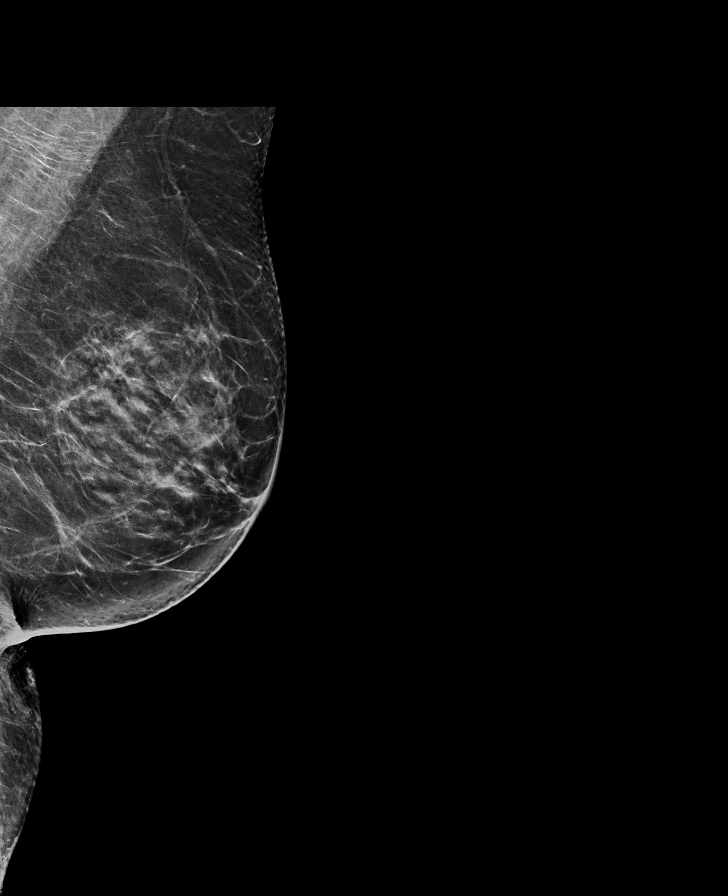

[R MLO synth-2D]
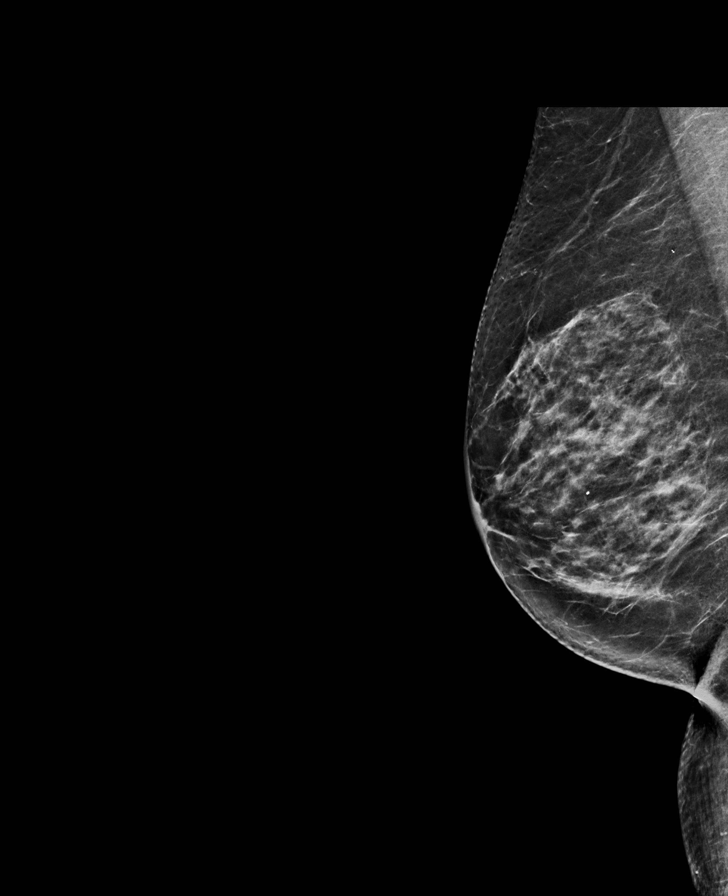

[R MLO tomo · tomo slice 35/68.0]
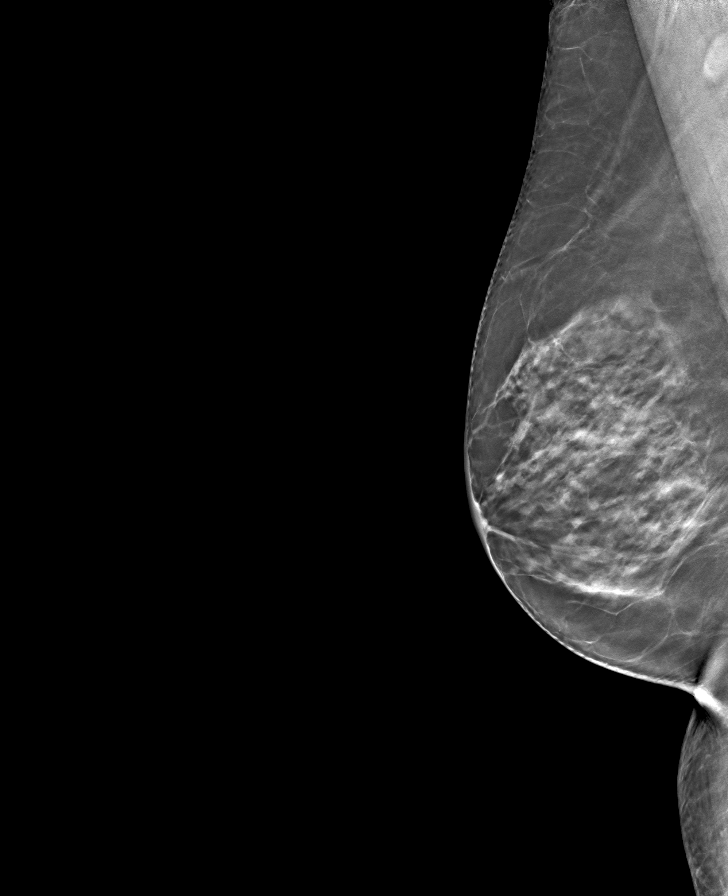

[R CC tomo · tomo slice 35/70.0]
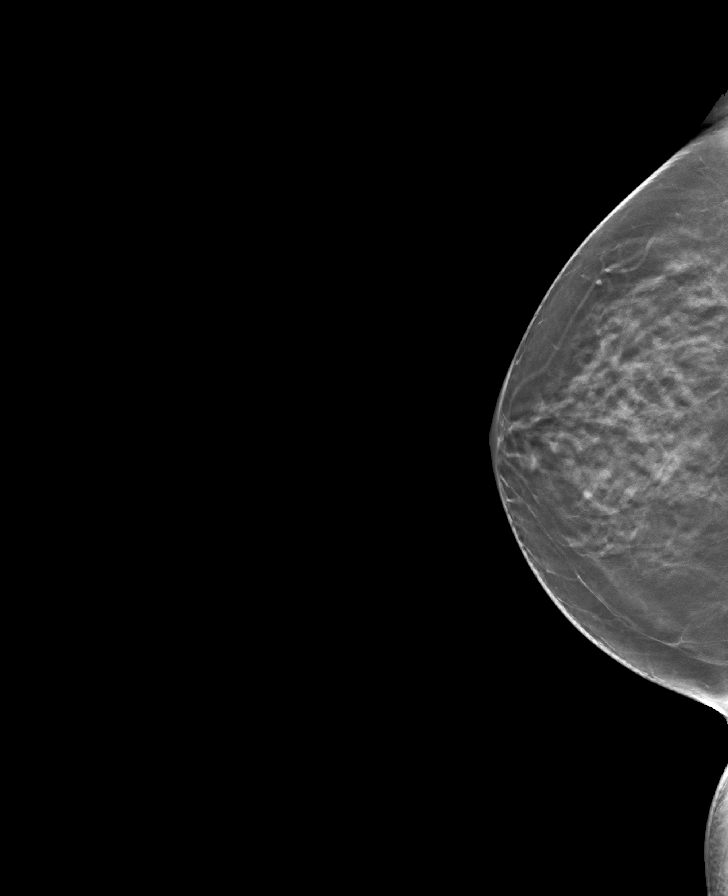

[L CC tomo · tomo slice 36/71.0]
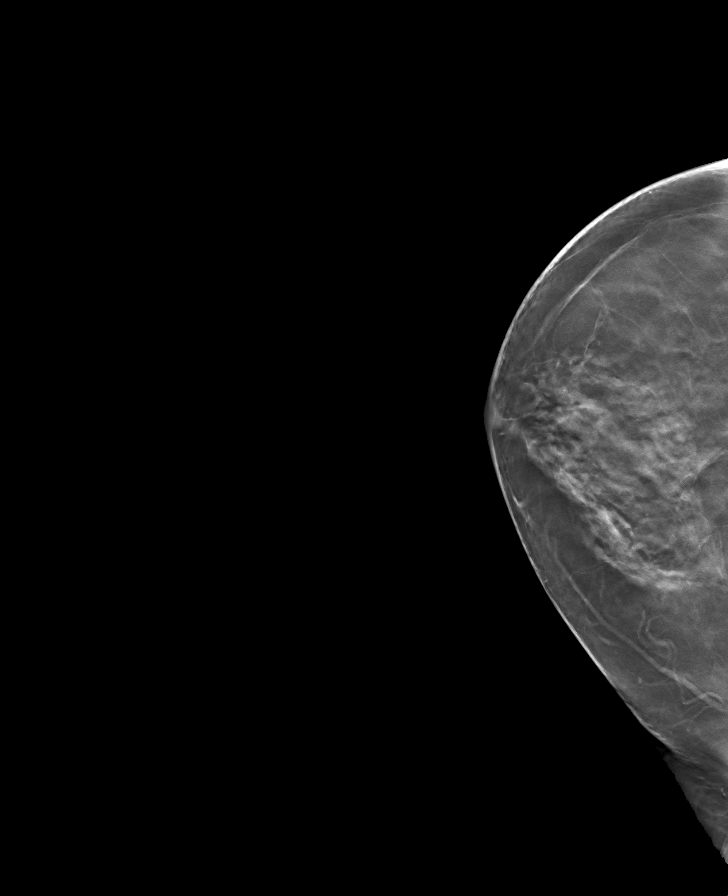

[L MLO tomo · tomo slice 37/72.0]
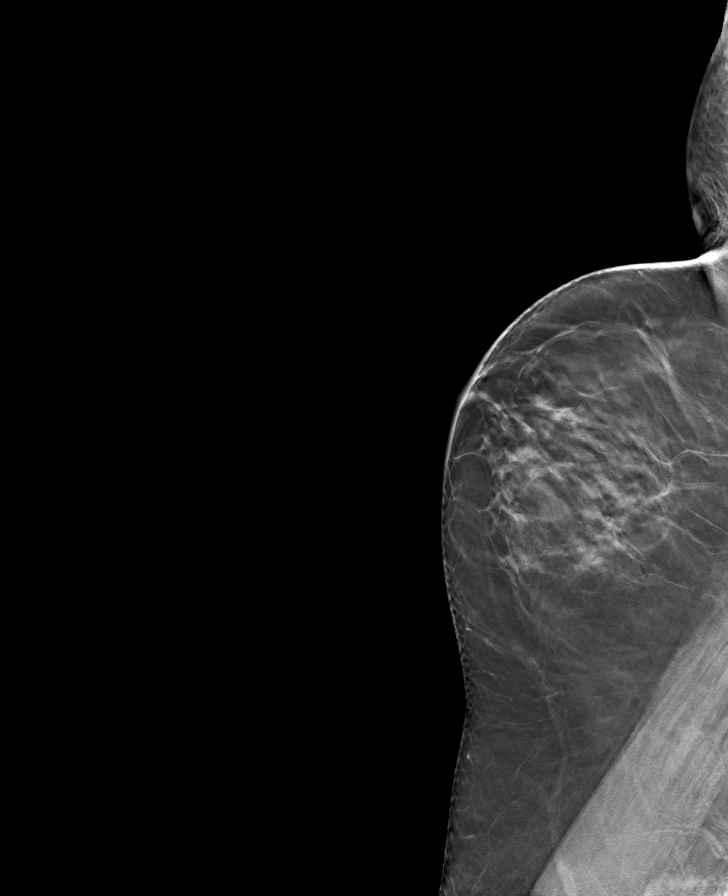

[8 of 24 positions shown; findings below may reference images not displayed]

ACR Breast Density Category c: The breast tissue is heterogeneously
dense, which may obscure small masses.
FINDINGS: There are no findings suspicious for malignancy. Images were
processed with CAD.
IMPRESSION: No mammographic evidence of malignancy. A result letter of this
screening mammogram will be mailed directly to the patient.

RECOMMENDATION:
Screening mammogram in one year. (Code:[id number])

BI-RADS CATEGORY  1: Negative.

## 2020-01-11 ENCOUNTER — Telehealth: Payer: Self-pay

## 2020-01-11 LAB — LIPID PANEL WITH LDL/HDL RATIO
Cholesterol, Total: 215 mg/dL — ABNORMAL HIGH (ref 100–199)
HDL: 50 mg/dL (ref >39)
LDL Chol Calc (NIH): 139 mg/dL — ABNORMAL HIGH (ref 0–99)
LDL/HDL Ratio: 2.8 ratio (ref 0.0–3.2)
Triglycerides: 145 mg/dL (ref 0–149)
VLDL Cholesterol Cal: 26 mg/dL (ref 5–40)

## 2020-01-11 LAB — COMPREHENSIVE METABOLIC PANEL
ALT: 16 IU/L (ref 0–32)
AST: 25 IU/L (ref 0–40)
Albumin/Globulin Ratio: 1.5 (ref 1.2–2.2)
Albumin: 4.4 g/dL (ref 3.7–4.7)
Alkaline Phosphatase: 109 IU/L (ref 48–121)
BUN/Creatinine Ratio: 11 — ABNORMAL LOW (ref 12–28)
BUN: 11 mg/dL (ref 8–27)
Bilirubin Total: 0.4 mg/dL (ref 0.0–1.2)
CO2: 26 mmol/L (ref 20–29)
Calcium: 9.6 mg/dL (ref 8.7–10.3)
Chloride: 100 mmol/L (ref 96–106)
Creatinine, Ser: 0.97 mg/dL (ref 0.57–1.00)
GFR calc Af Amer: 64 mL/min/{1.73_m2} (ref >59)
GFR calc non Af Amer: 56 mL/min/{1.73_m2} — ABNORMAL LOW (ref >59)
Globulin, Total: 2.9 g/dL (ref 1.5–4.5)
Glucose: 98 mg/dL (ref 65–99)
Potassium: 4.2 mmol/L (ref 3.5–5.2)
Sodium: 140 mmol/L (ref 134–144)
Total Protein: 7.3 g/dL (ref 6.0–8.5)

## 2020-01-11 LAB — CBC
Hematocrit: 38.7 % (ref 34.0–46.6)
Hemoglobin: 13.3 g/dL (ref 11.1–15.9)
MCH: 30.4 pg (ref 26.6–33.0)
MCHC: 34.4 g/dL (ref 31.5–35.7)
MCV: 88 fL (ref 79–97)
Platelets: 148 10*3/uL — ABNORMAL LOW (ref 150–450)
RBC: 4.38 x10E6/uL (ref 3.77–5.28)
RDW: 12.4 % (ref 11.7–15.4)
WBC: 3.9 10*3/uL (ref 3.4–10.8)

## 2020-01-11 LAB — TSH: TSH: 3.04 u[IU]/mL (ref 0.450–4.500)

## 2020-01-11 LAB — T4, FREE: Free T4: 0.93 ng/dL (ref 0.82–1.77)

## 2020-01-11 LAB — VITAMIN D 25 HYDROXY (VIT D DEFICIENCY, FRACTURES): Vit D, 25-Hydroxy: 22.9 ng/mL — ABNORMAL LOW (ref 30.0–100.0)

## 2020-01-11 NOTE — Telephone Encounter (Signed)
LMOM on both the home and cell phone listed asking pt to call back.

## 2020-01-11 NOTE — Telephone Encounter (Signed)
-----   Message from Lavera Guise, MD sent at 01/07/2020  3:21 PM EDT ----- Please call the pt and find out if she is having any dysuria or frequency of urine

## 2020-01-15 NOTE — Progress Notes (Signed)
Pt called back she don't have any symptoms for UTI and urine frequency

## 2020-01-17 NOTE — Telephone Encounter (Signed)
Spoke with pt, she did not have any symptoms /np

## 2020-01-21 ENCOUNTER — Other Ambulatory Visit: Payer: Self-pay

## 2020-01-21 DIAGNOSIS — I10 Essential (primary) hypertension: Secondary | ICD-10-CM

## 2020-01-21 MED ORDER — LOSARTAN POTASSIUM-HCTZ 100-25 MG PO TABS: 1.0000 | ORAL_TABLET | Freq: Every day | ORAL | 4 refills | Status: DC

## 2020-01-22 DIAGNOSIS — Z1231 Encounter for screening mammogram for malignant neoplasm of breast: Secondary | ICD-10-CM | POA: Insufficient documentation

## 2020-01-22 DIAGNOSIS — Z23 Encounter for immunization: Secondary | ICD-10-CM | POA: Insufficient documentation

## 2020-01-22 DIAGNOSIS — Z0001 Encounter for general adult medical examination with abnormal findings: Secondary | ICD-10-CM | POA: Insufficient documentation

## 2020-01-22 DIAGNOSIS — M1711 Unilateral primary osteoarthritis, right knee: Secondary | ICD-10-CM | POA: Insufficient documentation

## 2020-02-06 ENCOUNTER — Other Ambulatory Visit: Payer: Self-pay | Admitting: Nurse Practitioner

## 2020-02-06 DIAGNOSIS — Z1231 Encounter for screening mammogram for malignant neoplasm of breast: Secondary | ICD-10-CM

## 2020-02-10 NOTE — Progress Notes (Signed)
Reviewed. Mild vitamin d deficiency. Other labs good.

## 2020-02-29 ENCOUNTER — Other Ambulatory Visit: Payer: Self-pay

## 2020-02-29 DIAGNOSIS — E782 Mixed hyperlipidemia: Secondary | ICD-10-CM

## 2020-02-29 MED ORDER — BISOPROLOL FUMARATE 5 MG PO TABS: 5.0000 mg | ORAL_TABLET | Freq: Every day | ORAL | 5 refills | Status: DC

## 2020-03-17 ENCOUNTER — Other Ambulatory Visit: Payer: Self-pay

## 2020-03-17 ENCOUNTER — Ambulatory Visit
Admission: RE | Admit: 2020-03-17 | Discharge: 2020-03-17 | Disposition: A | Discharge status: Home / Self Care | Payer: PPO | Source: Ambulatory Visit | Attending: Nurse Practitioner | Admitting: Nurse Practitioner

## 2020-03-17 DIAGNOSIS — Z1231 Encounter for screening mammogram for malignant neoplasm of breast: Secondary | ICD-10-CM | POA: Insufficient documentation

## 2020-03-21 ENCOUNTER — Other Ambulatory Visit: Payer: Self-pay | Admitting: Nurse Practitioner

## 2020-03-21 DIAGNOSIS — N6489 Other specified disorders of breast: Secondary | ICD-10-CM

## 2020-03-21 DIAGNOSIS — R928 Other abnormal and inconclusive findings on diagnostic imaging of breast: Secondary | ICD-10-CM

## 2020-03-31 ENCOUNTER — Other Ambulatory Visit: Payer: Self-pay

## 2020-03-31 ENCOUNTER — Ambulatory Visit
Admission: RE | Admit: 2020-03-31 | Discharge: 2020-03-31 | Disposition: A | Discharge status: Home / Self Care | Payer: PPO | Source: Ambulatory Visit | Attending: Nurse Practitioner | Admitting: Nurse Practitioner

## 2020-03-31 DIAGNOSIS — R928 Other abnormal and inconclusive findings on diagnostic imaging of breast: Secondary | ICD-10-CM

## 2020-03-31 DIAGNOSIS — N6489 Other specified disorders of breast: Secondary | ICD-10-CM | POA: Diagnosis not present

## 2020-03-31 DIAGNOSIS — R922 Inconclusive mammogram: Secondary | ICD-10-CM | POA: Diagnosis not present

## 2020-04-08 NOTE — Progress Notes (Signed)
Negative mammogram

## 2020-04-08 NOTE — Progress Notes (Signed)
Negative after additional images.

## 2020-06-06 ENCOUNTER — Other Ambulatory Visit: Payer: Self-pay

## 2020-06-06 DIAGNOSIS — I10 Essential (primary) hypertension: Secondary | ICD-10-CM

## 2020-06-06 MED ORDER — LOSARTAN POTASSIUM-HCTZ 100-25 MG PO TABS: 1.0000 | ORAL_TABLET | Freq: Every day | ORAL | 4 refills | Status: DC

## 2020-07-03 ENCOUNTER — Encounter: Payer: Self-pay | Admitting: Hospice and Palliative Medicine

## 2020-07-03 ENCOUNTER — Ambulatory Visit (INDEPENDENT_AMBULATORY_CARE_PROVIDER_SITE_OTHER): Payer: PPO | Admitting: Physician Assistant

## 2020-07-03 DIAGNOSIS — M1712 Unilateral primary osteoarthritis, left knee: Secondary | ICD-10-CM | POA: Diagnosis not present

## 2020-07-03 DIAGNOSIS — E559 Vitamin D deficiency, unspecified: Secondary | ICD-10-CM | POA: Diagnosis not present

## 2020-07-03 DIAGNOSIS — R011 Cardiac murmur, unspecified: Secondary | ICD-10-CM

## 2020-07-03 DIAGNOSIS — E782 Mixed hyperlipidemia: Secondary | ICD-10-CM

## 2020-07-03 DIAGNOSIS — K219 Gastro-esophageal reflux disease without esophagitis: Secondary | ICD-10-CM

## 2020-07-03 DIAGNOSIS — I10 Essential (primary) hypertension: Secondary | ICD-10-CM

## 2020-07-03 MED ORDER — LANSOPRAZOLE 30 MG PO CPDR: 30.0000 mg | DELAYED_RELEASE_CAPSULE | Freq: Every day | ORAL | 0 refills | Status: DC

## 2020-07-03 NOTE — Progress Notes (Signed)
[facility] [place], [place] [address]  Internal MEDICINE  Office Visit Note  Patient Name: Rachael Knapp  [id number]  [id number]  Date of Service: 07/04/2020  Chief Complaint  Patient presents with  . Follow-up  . Gastroesophageal Reflux  . Hyperlipidemia  . Hypertension    HPI Pt is here for routine f/u. -She has allergies this time of year. She takes an antihistamine when needed. -GERD had been better, she had been eating smaller amounts and it improved. But past few weeks starting to get worse again and no longer has any reflux meds. Requesting refill. -She has been taking her BP meds, Zebeta and Hyzaar. -She still uses voltaren gel on L knee as needed for OA pain. She had prior arthroscopy on R knee and it worked well. She may go back to same physician to evaluate L knee now. -Vit D low but not supplementing currently. -She forgets her simvasatin sometimes but will do better about taking it daily.  Current Medication: Outpatient Encounter Medications as of 07/03/2020  Medication Sig  . bisoprolol (ZEBETA) 5 MG tablet Take 1 tablet (5 mg total) by mouth daily.  . CHLORPHENIRAMINE-PHENYLEPHRINE PO Take by mouth.  . diclofenac Sodium (VOLTAREN) 1 % GEL Apply 4 g topically 4 (four) times daily.  Marland Kitchen losartan-hydrochlorothiazide (HYZAAR) 100-25 MG tablet Take 1 tablet by mouth daily.  . simvastatin (ZOCOR) 20 MG tablet Take 1 tablet (20 mg total) by mouth daily at 6 PM.  . [DISCONTINUED] lansoprazole (PREVACID) 30 MG capsule Take 1 capsule (30 mg total) by mouth daily at 12 noon.  . lansoprazole (PREVACID) 30 MG capsule Take 1 capsule (30 mg total) by mouth daily at 12 noon.   No facility-administered encounter medications on file as of 07/03/2020.    Surgical History: Past Surgical History:  Procedure Laterality Date  . ABDOMINAL HYSTERECTOMY    . APPENDECTOMY    . CHOLECYSTECTOMY    . COLONOSCOPY WITH PROPOFOL N/A 05/15/2018   Procedure:  COLONOSCOPY WITH PROPOFOL;  Surgeon: Lollie Sails, MD;  Location: [facility] ENDOSCOPY;  Service: Endoscopy;  Laterality: N/A;  . HERNIA REPAIR    . KNEE ARTHROSCOPY      Medical History: Past Medical History:  Diagnosis Date  . GERD (gastroesophageal reflux disease)   . Hyperlipemia   . Hypertension     Family History: Family History  Problem Relation Age of Onset  . Cancer Father   . Hypertension Sister   . Cancer Sister   . Hypertension Brother   . Cancer Brother   . Cancer Brother   . Diabetes Sister   . Parkinson's disease Brother   . Breast cancer Neg Hx     Social History   Socioeconomic History  . Marital status: Widowed    Spouse name: Not on file  . Number of children: Not on file  . Years of education: Not on file  . Highest education level: Not on file  Occupational History  . Not on file  Tobacco Use  . Smoking status: Never Smoker  . Smokeless tobacco: Never Used  Vaping Use  . Vaping Use: Never used  Substance and Sexual Activity  . Alcohol use: No  . Drug use: Never  . Sexual activity: Not on file  Other Topics Concern  . Not on file  Social History Narrative  . Not on file   Social Determinants of Health   Financial Resource Strain: Not on file  Food Insecurity: Not on file  Transportation Needs: Not on file  Physical Activity: Not on file  Stress: Not on file  Social Connections: Not on file  Intimate Partner Violence: Not on file      Review of Systems  Constitutional: Negative for chills, fatigue and unexpected weight change.  HENT: Positive for postnasal drip. Negative for congestion, rhinorrhea, sneezing and sore throat.   Eyes: Negative for redness.  Respiratory: Negative for cough, chest tightness and shortness of breath.   Cardiovascular: Negative for chest pain and palpitations.  Gastrointestinal: Negative for abdominal pain, constipation, diarrhea, nausea and vomiting.  Genitourinary: Negative for dysuria and frequency.   Musculoskeletal: Positive for arthralgias. Negative for back pain, joint swelling and neck pain.       L knee pain  Skin: Negative for rash.  Neurological: Negative.  Negative for tremors and numbness.  Hematological: Negative for adenopathy. Does not bruise/bleed easily.  Psychiatric/Behavioral: Negative for behavioral problems (Depression), sleep disturbance and suicidal ideas. The patient is not nervous/anxious.     Vital Signs: BP 140/74   Pulse 67   Temp 97.8 F (36.6 C)   Resp 16   Ht 5\' 6"  (1.676 m)   Wt 161 lb 3.2 oz (73.1 kg)   SpO2 97%   BMI 26.02 kg/m    Physical Exam Vitals and nursing note reviewed.  Constitutional:      General: She is not in acute distress.    Appearance: She is well-developed. She is not diaphoretic.  HENT:     Head: Normocephalic and atraumatic.     Mouth/Throat:     Pharynx: No oropharyngeal exudate.  Eyes:     Pupils: Pupils are equal, round, and reactive to light.  Neck:     Thyroid: No thyromegaly.     Vascular: No JVD.     Trachea: No tracheal deviation.  Cardiovascular:     Rate and Rhythm: Normal rate and regular rhythm.     Heart sounds: Murmur heard.  No friction rub. No gallop.   Pulmonary:     Effort: Pulmonary effort is normal. No respiratory distress.     Breath sounds: No wheezing or rales.  Chest:     Chest wall: No tenderness.  Abdominal:     General: Bowel sounds are normal.     Palpations: Abdomen is soft.  Musculoskeletal:        General: Normal range of motion.     Cervical back: Normal range of motion and neck supple.  Lymphadenopathy:     Cervical: No cervical adenopathy.  Skin:    General: Skin is warm and dry.  Neurological:     Mental Status: She is alert and oriented to person, place, and time.     Cranial Nerves: No cranial nerve deficit.  Psychiatric:        Behavior: Behavior normal.        Thought Content: Thought content normal.        Judgment: Judgment normal.         Assessment/Plan: 1. Essential hypertension Stable, continue Zebeta and Hyzaar.  2. Gastroesophageal reflux disease without esophagitis Will restart on prevacid due to recent GERD symptoms. - lansoprazole (PREVACID) 30 MG capsule; Take 1 capsule (30 mg total) by mouth daily at 12 noon.  Dispense: 20 capsule; Refill: 0  3. Murmur, cardiac New murmur auscultated on exam today, no echo on record so will obtain one. - ECHO COMPLETE (BACK OFFICE); Future  4. Primary osteoarthritis of left knee Continue Voltaren gel as needed. Pt  plans to f/u with physician who fixed R knee.  5. Mixed hyperlipidemia Continue simvastatin daily.  6. Vitamin D deficiency Begin vit D and calcium supplements.  General Counseling: [name] understanding of the findings of todays visit and agrees with plan of treatment. I have discussed any further diagnostic evaluation that may be needed or ordered today. We also reviewed her medications today. she has been encouraged to call the office with any questions or concerns that should arise related to todays visit.    Orders Placed This Encounter  Procedures  . ECHO COMPLETE (BACK OFFICE)    Meds ordered this encounter  Medications  . lansoprazole (PREVACID) 30 MG capsule    Sig: Take 1 capsule (30 mg total) by mouth daily at 12 noon.    Dispense:  20 capsule    Refill:  0    This patient was seen by Drema Dallas, PA-C in collaboration with Dr. Clayborn Bigness as a part of collaborative care agreement.   Total time spent:30 Minutes Time spent includes review of chart, medications, test results, and follow up plan with the patient.      Dr Lavera Guise Internal medicine

## 2020-07-23 ENCOUNTER — Other Ambulatory Visit: Payer: PPO

## 2020-08-04 ENCOUNTER — Telehealth: Payer: Self-pay | Admitting: Internal Medicine

## 2020-08-04 NOTE — Progress Notes (Signed)
  Chronic Care Management   Outreach Note  08/04/2020 Name: Rachael Knapp MRN: [id number] DOB: [birth year]  Referred by: Lavera Guise, MD Reason for referral : No chief complaint on file.   An unsuccessful telephone outreach was attempted today. The patient was referred to the pharmacist for assistance with care management and care coordination.   Follow Up Plan:   Carley Perdue [employer] Scheduler

## 2020-08-20 ENCOUNTER — Ambulatory Visit: Payer: PPO

## 2020-08-20 ENCOUNTER — Other Ambulatory Visit: Payer: Self-pay

## 2020-08-20 DIAGNOSIS — R011 Cardiac murmur, unspecified: Secondary | ICD-10-CM | POA: Diagnosis not present

## 2020-08-29 ENCOUNTER — Telehealth: Payer: Self-pay | Admitting: Internal Medicine

## 2020-08-29 NOTE — Progress Notes (Signed)
  Chronic Care Management   Note  08/29/2020 Name: Shineka Auble MRN: [id number] DOB: 06/18/1940  Cinderella Christoffersen is a 80 y.o. year old female who is a primary care patient of Lavera Guise, MD. I reached out to Eula Flax by phone today in response to a referral sent by Ms. Alesia Banda Auriemma's PCP, Lavera Guise, MD.   Ms. Voorheis was given information about Chronic Care Management services today including:  1. CCM service includes personalized support from designated clinical staff supervised by her physician, including individualized plan of care and coordination with other care providers 2. 24/7 contact phone numbers for assistance for urgent and routine care needs. 3. Service will only be billed when office clinical staff spend 20 minutes or more in a month to coordinate care. 4. Only one practitioner may furnish and bill the service in a calendar month. 5. The patient may stop CCM services at any time (effective at the end of the month) by phone call to the office staff.   Patient agreed to services and verbal consent obtained.   Follow up plan:   Carley Perdue [employer] Scheduler

## 2020-09-18 ENCOUNTER — Telehealth: Payer: Self-pay | Admitting: Pharmacist

## 2020-09-18 ENCOUNTER — Ambulatory Visit: Payer: PPO | Admitting: Nurse Practitioner

## 2020-09-18 NOTE — Progress Notes (Addendum)
    Chronic Care Management Pharmacy Assistant   Name: [name]  MRN: [id number] DOB: 10-Apr-1941  Rachael Knapp is an 80 y.o. year old female who presents for his initial CCM visit with the clinical pharmacist.  Reason for Encounter: Chart Prep   Conditions to be addressed/monitored: HTN, GERD, HLD, Vitamin D deficiency  Primary concerns for visit include: HTN  Recent office visits:  07/03/20 Mylinda Latina, PA-C. For follow-up. Per note: Will restart on prevacid due to recent GERD symptoms.  Recent consult visits:  None in the last months  Hospital visits:  None in previous 6 months  Medications: Outpatient Encounter Medications as of 09/18/2020  Medication Sig   bisoprolol (ZEBETA) 5 MG tablet Take 1 tablet (5 mg total) by mouth daily.   CHLORPHENIRAMINE-PHENYLEPHRINE PO Take by mouth.   diclofenac Sodium (VOLTAREN) 1 % GEL Apply 4 g topically 4 (four) times daily.   lansoprazole (PREVACID) 30 MG capsule Take 1 capsule (30 mg total) by mouth daily at 12 noon.   losartan-hydrochlorothiazide (HYZAAR) 100-25 MG tablet Take 1 tablet by mouth daily.   simvastatin (ZOCOR) 20 MG tablet Take 1 tablet (20 mg total) by mouth daily at 6 PM.   No facility-administered encounter medications on file as of 09/18/2020.    Have you seen any other providers since your last visit? Patient stated no.  Any changes in your medications or health? Patient stated no.  Any side effects from any medications? Patient stated no.  Do you have an symptoms or problems not managed by your medications? Patient stated no.  Any concerns about your health right now? Patient stated her left knee.   Has your provider asked that you check blood pressure, blood sugar, or follow special diet at home? Patient stated no.  Do you get any type of exercise on a regular basis? Patient stated no.  Can you think of a goal you would like to reach for your health? Patient stated to loose  weight.   Do you have any problems getting your medications? Patient stated no.  Is there anything that you would like to discuss during the appointment? Patient stated no.  Please bring medications and supplements to appointment, patient rescheduled her appointment to 09/30/20 at 230 pm OTP.  Follow-Up:Pharmacist Review   Charlann Lange, [place] Pharmacist Assistant [phone]

## 2020-09-18 NOTE — Progress Notes (Deleted)
Chronic Care Management Pharmacy Note  09/18/2020 Name:  Elliott Quade MRN:  [id number] DOB:  1941/01/16  Subjective: Rachael Knapp is an 80 y.o. year old female who is a primary patient of Humphrey Rolls Timoteo Gaul, MD.  The CCM team was consulted for assistance with disease management and care coordination needs.    Engaged with patient by telephone for initial visit in response to provider referral for pharmacy case management and/or care coordination services.   Consent to Services:  The patient was given the following information about Chronic Care Management services today, agreed to services, and gave verbal consent: 1. CCM service includes personalized support from designated clinical staff supervised by the primary care provider, including individualized plan of care and coordination with other care providers 2. 24/7 contact phone numbers for assistance for urgent and routine care needs. 3. Service will only be billed when office clinical staff spend 20 minutes or more in a month to coordinate care. 4. Only one practitioner may furnish and bill the service in a calendar month. 5.The patient may stop CCM services at any time (effective at the end of the month) by phone call to the office staff. 6. The patient will be responsible for cost sharing (co-pay) of up to 20% of the service fee (after annual deductible is met). Patient agreed to services and consent obtained.  Patient Care Team: Lavera Guise, MD as PCP - General (Internal Medicine) Edythe Clarity, [facility] as Pharmacist (Pharmacist)  Recent office visits:  07/03/20 Mylinda Latina, PA-C. For follow-up. Per note: Will restart on prevacid due to recent GERD symptoms.  Recent consult visits:  None in the last months  Hospital visits:  None in previous 6 months  Objective:  Lab Results  Component Value Date   CREATININE 0.97 01/10/2020   BUN 11 01/10/2020   GFRNONAA 56 (L) 01/10/2020   GFRAA 64 01/10/2020    NA 140 01/10/2020   K 4.2 01/10/2020   CALCIUM 9.6 01/10/2020   CO2 26 01/10/2020   GLUCOSE 98 01/10/2020    No results found for: HGBA1C, FRUCTOSAMINE, GFR, MICROALBUR  Last diabetic Eye exam: No results found for: HMDIABEYEEXA  Last diabetic Foot exam: No results found for: HMDIABFOOTEX   Lab Results  Component Value Date   CHOL 215 (H) 01/10/2020   HDL 50 01/10/2020   LDLCALC 139 (H) 01/10/2020   TRIG 145 01/10/2020    Hepatic Function Latest Ref Rng & Units 01/10/2020 01/11/2019 01/05/2018  Total Protein 6.0 - 8.5 g/dL 7.3 7.1 7.5  Albumin 3.7 - 4.7 g/dL 4.4 4.3 4.4  AST 0 - 40 IU/L 25 18 24   ALT 0 - 32 IU/L 16 16 15   Alk Phosphatase 48 - 121 IU/L 109 104 92  Total Bilirubin 0.0 - 1.2 mg/dL 0.4 0.3 0.4    Lab Results  Component Value Date/Time   TSH 3.040 01/10/2020 08:30 AM   TSH 3.120 01/11/2019 08:26 AM   FREET4 0.93 01/10/2020 08:30 AM   FREET4 0.95 01/11/2019 08:26 AM    CBC Latest Ref Rng & Units 01/10/2020 01/11/2019 01/05/2018  WBC 3.4 - 10.8 x10E3/uL 3.9 4.2 4.2  Hemoglobin 11.1 - 15.9 g/dL 13.3 13.4 14.0  Hematocrit 34.0 - 46.6 % 38.7 41.0 40.1  Platelets 150 - 450 x10E3/uL 148(L) 143(L) 150    Lab Results  Component Value Date/Time   VD25OH 22.9 (L) 01/10/2020 08:30 AM   VD25OH 26.3 (L) 01/05/2018 08:33 AM    Clinical ASCVD: {YES/NO:21197}  The ASCVD Risk score Mikey Bussing DC Jr., et al., 2013) failed to calculate for the following reasons:   The 2013 ASCVD risk score is only valid for ages 69 to 26    Depression screen PHQ 2/9 07/03/2020 01/03/2020 01/02/2019  Decreased Interest 0 0 0  Down, Depressed, Hopeless 0 0 0  PHQ - 2 Score 0 0 0     ***Other: (CHADS2VASc if Afib, MMRC or CAT for COPD, ACT, DEXA)  Social History   Tobacco Use  Smoking Status Never Smoker  Smokeless Tobacco Never Used   BP Readings from Last 3 Encounters:  07/03/20 140/74  01/03/20 (!) 152/86  02/07/19 (!) 150/79   Pulse Readings from Last 3 Encounters:  07/03/20 67  01/03/20  65  02/07/19 60   Wt Readings from Last 3 Encounters:  07/03/20 161 lb 3.2 oz (73.1 kg)  01/03/20 161 lb (73 kg)  02/07/19 158 lb (71.7 kg)   BMI Readings from Last 3 Encounters:  07/03/20 26.02 kg/m  01/03/20 25.99 kg/m  02/07/19 25.50 kg/m    Assessment/Interventions: Review of patient past medical history, allergies, medications, health status, including review of consultants reports, laboratory and other test data, was performed as part of comprehensive evaluation and provision of chronic care management services.   SDOH:  (Social Determinants of Health) assessments and interventions performed: {yes/no:20286}  SDOH Screenings   Alcohol Screen: Low Risk   . Last Alcohol Screening Score (AUDIT): 0  Depression (PHQ2-9): Low Risk   . PHQ-2 Score: 0  Financial Resource Strain: Not on file  Food Insecurity: Not on file  Housing: Not on file  Physical Activity: Not on file  Social Connections: Not on file  Stress: Not on file  Tobacco Use: Low Risk   . Smoking Tobacco Use: Never Smoker  . Smokeless Tobacco Use: Never Used  Transportation Needs: Not on file    CCM Care Plan  Allergies  Allergen Reactions  . Amoxicillin Shortness Of Breath  . Ranitidine Palpitations  . Clindamycin Palmitate Hcl Rash  . Clindamycin/Lincomycin Rash  . Lincomycin Rash    Medications Reviewed Today    Reviewed by Jimmye Norman, CMA (Certified Medical Assistant) on 07/03/20 at 1502  Med List Status: <None>  Medication Order Taking? Sig Documenting Provider Last Dose Status Informant  bisoprolol (ZEBETA) 5 MG tablet [id number]  Take 1 tablet (5 mg total) by mouth daily. Ronnell Freshwater, NP  Active   CHLORPHENIRAMINE-PHENYLEPHRINE PO [id number]  Take by mouth. [provider]  Active   diclofenac Sodium (VOLTAREN) 1 % GEL [id number]  Apply 4 g topically 4 (four) times daily. Ronnell Freshwater, NP  Active   lansoprazole (PREVACID) 30 MG capsule [id number]  Take 1 capsule (30  mg total) by mouth daily at 12 noon. Kendell Bane, NP  Active   losartan-hydrochlorothiazide [facility]) 100-25 MG tablet [id number]  Take 1 tablet by mouth daily. Luiz Ochoa, NP  Active   simvastatin (ZOCOR) 20 MG tablet [id number]  Take 1 tablet (20 mg total) by mouth daily at 6 PM. Ronnell Freshwater, NP  Active           Patient Active Problem List   Diagnosis Date Noted  . Encounter for general adult medical examination with abnormal findings 01/22/2020  . Primary osteoarthritis of right knee 01/22/2020  . Encounter for screening mammogram for malignant neoplasm of breast 01/22/2020  . Flu vaccine need 01/22/2020  . Screening for colon cancer 12/30/2017  . Vitamin D deficiency  12/30/2017  . Need for vaccination against Streptococcus pneumoniae using pneumococcal conjugate vaccine 13 12/30/2017  . Dysuria 12/30/2017  . Essential hypertension 08/01/2017  . Hyperlipemia 07/14/2017  . GERD (gastroesophageal reflux disease) 07/14/2017    Immunization History  Administered Date(s) Administered  . Influenza Inj Mdck Quad Pf 01/03/2020  . Influenza, High Dose Seasonal PF 04/08/2018  . Influenza-Unspecified 04/16/2017  . PFIZER(Purple Top)SARS-COV-2 Vaccination 06/25/2019, 07/16/2019  . Pneumococcal Polysaccharide-23 01/02/2020    Conditions to be addressed/monitored:  HTN, GERD, Arthritis, HLD  There are no care plans that you recently modified to display for this patient.    Medication Assistance: {MEDASSISTANCEINFO:25044}  Patient's preferred pharmacy is:  [place], [place] [facility] [address] [place] [address] Phone: [phone] Fax: [phone]  Uses pill box? {Yes or If no, why not?:20788} Pt endorses ***% compliance  We discussed: {Pharmacy options:24294} Patient decided to: {US Pharmacy Plan:23885}  Care Plan and Follow Up Patient Decision:  {FOLLOWUP:24991}  Plan: {CM FOLLOW UP TZGY:17494}  *** Current Barriers:   . {pharmacybarriers:24917}  Pharmacist Clinical Goal(s):  Marland Kitchen Patient will {PHARMACYGOALCHOICES:24921} through collaboration with PharmD and provider.   Interventions: . 1:1 collaboration with Lavera Guise, MD regarding development and update of comprehensive plan of care as evidenced by provider attestation and co-signature . Inter-disciplinary care team collaboration (see longitudinal plan of care) . Comprehensive medication review performed; medication list updated in electronic medical record  Hypertension (BP goal {CHL HP UPSTREAM Pharmacist BP ranges:[id number]}) -{US controlled/uncontrolled:25276} -Current treatment: . *** -Medications previously tried: ***  -Current home readings: *** -Current dietary habits: *** -Current exercise habits: *** -{ACTIONS;DENIES/REPORTS:21021675::"Denies"} hypotensive/hypertensive symptoms -Educated on {CCM BP Counseling:25124} -Counseled to monitor BP at home ***, document, and provide log at future appointments -{CCMPHARMDINTERVENTION:25122}  Hyperlipidemia: (LDL goal < ***) -{US controlled/uncontrolled:25276} -Current treatment: . *** -Medications previously tried: ***  -Current dietary patterns: *** -Current exercise habits: *** -Educated on {CCM HLD Counseling:25126} -{CCMPHARMDINTERVENTION:25122}  Arthritis (Goal: ***) -{US controlled/uncontrolled:25276} -Current treatment  . *** -Medications previously tried: ***  -{CCMPHARMDINTERVENTION:25122}  GERD (Goal: ***) -{US controlled/uncontrolled:25276} -Current treatment  . *** -Medications previously tried: ***  -{CCMPHARMDINTERVENTION:25122}   Patient Goals/Self-Care Activities . Patient will:  - {pharmacypatientgoals:24919}  Follow Up Plan: {CM FOLLOW UP WHQP:59163}

## 2020-09-24 ENCOUNTER — Ambulatory Visit: Payer: PPO

## 2020-09-30 ENCOUNTER — Telehealth: Payer: PPO

## 2020-09-30 DIAGNOSIS — K219 Gastro-esophageal reflux disease without esophagitis: Secondary | ICD-10-CM | POA: Diagnosis not present

## 2020-09-30 DIAGNOSIS — E785 Hyperlipidemia, unspecified: Secondary | ICD-10-CM | POA: Diagnosis not present

## 2020-09-30 DIAGNOSIS — I10 Essential (primary) hypertension: Secondary | ICD-10-CM | POA: Diagnosis not present

## 2020-10-22 ENCOUNTER — Other Ambulatory Visit: Payer: Self-pay

## 2020-10-22 DIAGNOSIS — I10 Essential (primary) hypertension: Secondary | ICD-10-CM

## 2020-10-22 MED ORDER — LOSARTAN POTASSIUM-HCTZ 100-25 MG PO TABS: 1.0000 | ORAL_TABLET | Freq: Every day | ORAL | 1 refills | Status: DC

## 2020-10-23 ENCOUNTER — Telehealth: Payer: Self-pay

## 2020-10-23 ENCOUNTER — Other Ambulatory Visit: Payer: Self-pay

## 2020-10-23 DIAGNOSIS — I10 Essential (primary) hypertension: Secondary | ICD-10-CM

## 2020-10-23 NOTE — Telephone Encounter (Signed)
lmom to how is taking her losartan-hctz or both med separate

## 2020-11-03 DIAGNOSIS — Z20822 Contact with and (suspected) exposure to covid-19: Secondary | ICD-10-CM | POA: Diagnosis not present

## 2020-11-03 DIAGNOSIS — Z03818 Encounter for observation for suspected exposure to other biological agents ruled out: Secondary | ICD-10-CM | POA: Diagnosis not present

## 2020-12-01 ENCOUNTER — Other Ambulatory Visit: Payer: Self-pay

## 2020-12-01 DIAGNOSIS — I10 Essential (primary) hypertension: Secondary | ICD-10-CM

## 2020-12-01 MED ORDER — LOSARTAN POTASSIUM-HCTZ 100-25 MG PO TABS: 1.0000 | ORAL_TABLET | Freq: Every day | ORAL | 1 refills | Status: DC

## 2020-12-30 ENCOUNTER — Telehealth: Payer: Self-pay | Admitting: Internal Medicine

## 2020-12-30 NOTE — Chronic Care Management (AMB) (Signed)
Left vm to r/s missed apt  

## 2021-01-15 ENCOUNTER — Ambulatory Visit: Payer: PPO | Admitting: Physician Assistant

## 2021-01-16 ENCOUNTER — Telehealth: Payer: Self-pay

## 2021-01-16 NOTE — Telephone Encounter (Signed)
Left vm to confirm 01/19/21 appointment-Rachael Knapp

## 2021-01-19 ENCOUNTER — Other Ambulatory Visit: Payer: Self-pay

## 2021-01-19 ENCOUNTER — Encounter: Payer: Self-pay | Admitting: Physician Assistant

## 2021-01-19 ENCOUNTER — Ambulatory Visit (INDEPENDENT_AMBULATORY_CARE_PROVIDER_SITE_OTHER): Payer: PPO | Admitting: Physician Assistant

## 2021-01-19 ENCOUNTER — Encounter (INDEPENDENT_AMBULATORY_CARE_PROVIDER_SITE_OTHER): Payer: Self-pay

## 2021-01-19 DIAGNOSIS — E559 Vitamin D deficiency, unspecified: Secondary | ICD-10-CM | POA: Diagnosis not present

## 2021-01-19 DIAGNOSIS — Z1231 Encounter for screening mammogram for malignant neoplasm of breast: Secondary | ICD-10-CM | POA: Diagnosis not present

## 2021-01-19 DIAGNOSIS — Z01419 Encounter for gynecological examination (general) (routine) without abnormal findings: Secondary | ICD-10-CM

## 2021-01-19 DIAGNOSIS — R5383 Other fatigue: Secondary | ICD-10-CM | POA: Diagnosis not present

## 2021-01-19 DIAGNOSIS — Z0001 Encounter for general adult medical examination with abnormal findings: Secondary | ICD-10-CM | POA: Diagnosis not present

## 2021-01-19 DIAGNOSIS — R300 Dysuria: Secondary | ICD-10-CM

## 2021-01-19 DIAGNOSIS — M1712 Unilateral primary osteoarthritis, left knee: Secondary | ICD-10-CM | POA: Diagnosis not present

## 2021-01-19 DIAGNOSIS — E782 Mixed hyperlipidemia: Secondary | ICD-10-CM | POA: Diagnosis not present

## 2021-01-19 DIAGNOSIS — K219 Gastro-esophageal reflux disease without esophagitis: Secondary | ICD-10-CM | POA: Diagnosis not present

## 2021-01-19 DIAGNOSIS — I340 Nonrheumatic mitral (valve) insufficiency: Secondary | ICD-10-CM | POA: Diagnosis not present

## 2021-01-19 DIAGNOSIS — Z23 Encounter for immunization: Secondary | ICD-10-CM

## 2021-01-19 DIAGNOSIS — I10 Essential (primary) hypertension: Secondary | ICD-10-CM | POA: Diagnosis not present

## 2021-01-19 MED ORDER — TETANUS-DIPHTH-ACELL PERTUSSIS 5-2.5-18.5 LF-MCG/0.5 IM SUSP: 0.5000 mL | Freq: Once | INTRAMUSCULAR | 0 refills | Status: AC

## 2021-01-19 NOTE — Progress Notes (Signed)
[facility] [place],  [address]  Internal MEDICINE  Office Visit Note  Patient Name: Rachael Knapp  [id number]  [id number]  Date of Service: 01/21/2021  Chief Complaint  Patient presents with   Medicare Wellness   Hypertension   Hyperlipidemia     HPI Pt is here for routine health maintenance examination -Would like to get her Flu shot today -Takes losartan-hctz in Am and 1/2 tab bisoprolol in PM, but HR initially low in office today. BP initially high, but was hurrying from work today. 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 BP cuff needs batteries and will get this -GERD mostly well controlled, only takes prevacid as needed. -left knee has been bothering her more, looking to get back in with provider who did arthroscopic on right knee -borderline LA and RA dilation, normal EF, mild MR seen on echo and mild AV sclerosis. Repeat in 3 years. -Sleeps well, does snore some, but no gasping or startling awake. Denies feeling sleepy. Not interested in a sleep study at this time. -Will repeat mammogram -due for routine labs and tdap will be sent to pharmacy  Current Medication: Outpatient Encounter Medications as of 01/19/2021  Medication Sig   bisoprolol (ZEBETA) 5 MG tablet Take 1 tablet (5 mg total) by mouth daily.   CHLORPHENIRAMINE-PHENYLEPHRINE PO Take by mouth.   diclofenac Sodium (VOLTAREN) 1 % GEL Apply 4 g topically 4 (four) times daily.   lansoprazole (PREVACID) 30 MG capsule Take 1 capsule (30 mg total) by mouth daily at 12 noon.   losartan-hydrochlorothiazide (HYZAAR) 100-25 MG tablet Take 1 tablet by mouth daily.   simvastatin (ZOCOR) 20 MG tablet Take 1 tablet (20 mg total) by mouth daily at 6 PM.   [EXPIRED] Tdap (BOOSTRIX) 5-2.5-18.5 LF-MCG/0.5 injection Inject 0.5 mLs into the muscle once for 1 dose.   No facility-administered encounter medications on file as of 01/19/2021.     Surgical History: Past Surgical History:  Procedure Laterality Date   ABDOMINAL HYSTERECTOMY     APPENDECTOMY     CHOLECYSTECTOMY     COLONOSCOPY WITH PROPOFOL N/A 05/15/2018   Procedure: COLONOSCOPY WITH PROPOFOL;  Surgeon: Lollie Sails, MD;  Location: [facility] ENDOSCOPY;  Service: Endoscopy;  Laterality: N/A;   HERNIA REPAIR     KNEE ARTHROSCOPY      Medical History: Past Medical History:  Diagnosis Date   GERD (gastroesophageal reflux disease)    Hyperlipemia    Hypertension     Family History: Family History  Problem Relation Age of Onset   Cancer Father    Hypertension Sister    Cancer Sister    Hypertension Brother    Cancer Brother    Cancer Brother    Diabetes Sister    Parkinson's disease Brother    Breast cancer Neg Hx       Review of Systems  Constitutional:  Negative for chills, fatigue and unexpected weight change.  HENT:  Negative for congestion, postnasal drip, rhinorrhea, sneezing and sore throat.   Eyes:  Negative for redness.  Respiratory:  Negative for cough, chest tightness and shortness of breath.   Cardiovascular:  Negative for chest pain and palpitations.  Gastrointestinal:  Negative for abdominal pain, constipation, diarrhea, nausea and vomiting.  Genitourinary:  Negative for dysuria and frequency.  Musculoskeletal:  Positive for arthralgias. Negative for back pain, joint swelling and neck pain.       L knee pain  Skin:  Negative  for rash.  Neurological: Negative.  Negative for tremors and numbness.  Hematological:  Negative for adenopathy. Does not bruise/bleed easily.  Psychiatric/Behavioral:  Negative for behavioral problems (Depression), sleep disturbance and suicidal ideas. The patient is not nervous/anxious.     Vital Signs: BP 132/70 Comment: 172/82  Pulse (!) 55   Temp 97.9 F (36.6 C)   Resp 16   Ht '5\' 5"'$  (1.651 m)   Wt 159 lb (72.1 kg)   SpO2 98%   BMI 26.46 kg/m    Physical Exam Constitutional:      General:  She is not in acute distress.    Appearance: She is well-developed. She is not diaphoretic.  HENT:     Head: Normocephalic and atraumatic.     Right Ear: External ear normal.     Left Ear: External ear normal.     Nose: Nose normal.     Mouth/Throat:     Pharynx: No oropharyngeal exudate.  Eyes:     General: No scleral icterus.       Right eye: No discharge.        Left eye: No discharge.     Conjunctiva/sclera: Conjunctivae normal.     Pupils: Pupils are equal, round, and reactive to light.  Neck:     Thyroid: No thyromegaly.     Vascular: No JVD.     Trachea: No tracheal deviation.  Cardiovascular:     Rate and Rhythm: Normal rate and regular rhythm.     Heart sounds: Normal heart sounds. No murmur heard.   No friction rub. No gallop.  Pulmonary:     Effort: Pulmonary effort is normal. No respiratory distress.     Breath sounds: Normal breath sounds. No stridor. No wheezing or rales.  Chest:     Chest wall: No tenderness.  Abdominal:     General: Bowel sounds are normal. There is no distension.     Palpations: Abdomen is soft. There is no mass.     Tenderness: There is no abdominal tenderness. There is no guarding or rebound.  Musculoskeletal:        General: No tenderness or deformity. Normal range of motion.     Cervical back: Normal range of motion and neck supple.     Right lower leg: No edema.     Left lower leg: No edema.  Lymphadenopathy:     Cervical: No cervical adenopathy.  Skin:    General: Skin is warm and dry.     Coloration: Skin is not pale.     Findings: No erythema or rash.  Neurological:     Mental Status: She is alert.     Cranial Nerves: No cranial nerve deficit.     Motor: No abnormal muscle tone.     Coordination: Coordination normal.     Deep Tendon Reflexes: Reflexes are normal and symmetric.  Psychiatric:        Behavior: Behavior normal.        Thought Content: Thought content normal.        Judgment: Judgment normal.      LABS: Recent Results (from the past 2160 hour(s))  UA/M w/rflx Culture, Routine     Status: Abnormal (Preliminary result)   Collection Time: 01/19/21  4:27 PM   Specimen: Urine   Urine  Result Value Ref Range   Specific Gravity, UA 1.007 1.005 - 1.030   pH, UA 6.5 5.0 - 7.5   Color, UA Yellow Yellow   Appearance Ur Clear  Clear   Leukocytes,UA Trace (A) Negative   Protein,UA Negative Negative/Trace   Glucose, UA Negative Negative   Ketones, UA Negative Negative   RBC, UA Negative Negative   Bilirubin, UA Negative Negative   Urobilinogen, Ur 0.2 0.2 - 1.0 mg/dL   Nitrite, UA Negative Negative   Microscopic Examination See below:     Comment: Microscopic was indicated and was performed.   Urinalysis Reflex Comment     Comment: This specimen has reflexed to a Urine Culture.  Microscopic Examination     Status: None   Collection Time: 01/19/21  4:27 PM   Urine  Result Value Ref Range   WBC, UA None seen 0 - 5 /hpf   RBC None seen 0 - 2 /hpf   Epithelial Cells (non renal) None seen 0 - 10 /hpf   Casts None seen None seen /lpf   Bacteria, UA None seen None seen/Few  Urine Culture, Reflex     Status: None (Preliminary result)   Collection Time: 01/19/21  4:27 PM   Urine  Result Value Ref Range   Urine Culture, Routine WILL FOLLOW         Assessment/Plan: 1. Encounter for general adult medical examination with abnormal findings CPE performed, routine labs ordered, mammogram ordered  2. Visit for gynecologic examination Breast exam performed  3. Essential hypertension BP initially high however improved significantly on recheck therefore we will keep medications the same and have patient monitor at home.  Heart rate also initially low however patient denies any symptoms and improved on examination  4. Mild mitral regurgitation Seen on echo we will plan for repeat in 3 years.  Patient denies any symptoms of shortness of breath or lower extremity swelling  5.  Gastroesophageal reflux disease without esophagitis Continue Prevacid  6. Primary osteoarthritis of left knee May follow-up with Ortho  7. Encounter for screening mammogram for breast cancer - MM 3D SCREEN BREAST BILATERAL; Future  8. Need for influenza vaccination - Flu Vaccine MDCK QUAD PF  9. Need for diphtheria-tetanus-pertussis (Tdap) vaccine - Tdap (BOOSTRIX) 5-2.5-18.5 LF-MCG/0.5 injection; Inject 0.5 mLs into the muscle once for 1 dose.  Dispense: 0.5 mL; Refill: 0  10. Mixed hyperlipidemia Continue simvastatin and will update labs - Lipid Panel With LDL/HDL Ratio  11. Vitamin D deficiency - VITAMIN D 25 Hydroxy (Vit-D Deficiency, Fractures)  12. Other fatigue - CBC w/Diff/Platelet - Comprehensive metabolic panel - TSH + free T4  13. Dysuria - UA/M w/rflx Culture, Routine   General Counseling: [name] understanding of the findings of todays visit and agrees with plan of treatment. I have discussed any further diagnostic evaluation that may be needed or ordered today. We also reviewed her medications today. she has been encouraged to call the office with any questions or concerns that should arise related to todays visit.    Counseling:    Orders Placed This Encounter  Procedures   Microscopic Examination   Urine Culture, Reflex   MM 3D SCREEN BREAST BILATERAL   Flu Vaccine MDCK QUAD PF   UA/M w/rflx Culture, Routine   CBC w/Diff/Platelet   Comprehensive metabolic panel   TSH + free T4   Lipid Panel With LDL/HDL Ratio   VITAMIN D 25 Hydroxy (Vit-D Deficiency, Fractures)    Meds ordered this encounter  Medications   Tdap (BOOSTRIX) 5-2.5-18.5 LF-MCG/0.5 injection    Sig: Inject 0.5 mLs into the muscle once for 1 dose.    Dispense:  0.5 mL  Refill:  0    This patient was seen by Drema Dallas, PA-C in collaboration with Dr. Clayborn Bigness as a part of collaborative care agreement.  Total time spent:40 Minutes  Time spent includes  review of chart, medications, test results, and follow up plan with the patient.     Lavera Guise, MD  Internal Medicine

## 2021-01-20 ENCOUNTER — Telehealth: Payer: Self-pay

## 2021-01-20 NOTE — Telephone Encounter (Signed)
Left vm letting patient now her mammogram appointment is scheduled for 03/19/21 @ 2:00 @ [place]

## 2021-01-23 DIAGNOSIS — Z20822 Contact with and (suspected) exposure to covid-19: Secondary | ICD-10-CM | POA: Diagnosis not present

## 2021-01-23 DIAGNOSIS — Z03818 Encounter for observation for suspected exposure to other biological agents ruled out: Secondary | ICD-10-CM | POA: Diagnosis not present

## 2021-01-24 LAB — URINE CULTURE, REFLEX

## 2021-01-24 LAB — MICROSCOPIC EXAMINATION
Bacteria, UA: NONE SEEN
Casts: NONE SEEN /lpf
Epithelial Cells (non renal): NONE SEEN /hpf (ref 0–10)
RBC, Urine: NONE SEEN /hpf (ref 0–2)
WBC, UA: NONE SEEN /hpf (ref 0–5)

## 2021-01-24 LAB — UA/M W/RFLX CULTURE, ROUTINE
Bilirubin, UA: NEGATIVE
Glucose, UA: NEGATIVE
Ketones, UA: NEGATIVE
Nitrite, UA: NEGATIVE
Protein,UA: NEGATIVE
RBC, UA: NEGATIVE
Specific Gravity, UA: 1.007 (ref 1.005–1.030)
Urobilinogen, Ur: 0.2 mg/dL (ref 0.2–1.0)
pH, UA: 6.5 (ref 5.0–7.5)

## 2021-01-27 ENCOUNTER — Telehealth: Payer: Self-pay

## 2021-01-27 ENCOUNTER — Other Ambulatory Visit: Payer: Self-pay | Admitting: Physician Assistant

## 2021-01-27 DIAGNOSIS — N3000 Acute cystitis without hematuria: Secondary | ICD-10-CM

## 2021-01-27 MED ORDER — NITROFURANTOIN MONOHYD MACRO 100 MG PO CAPS: ORAL_CAPSULE | ORAL | 0 refills | Status: DC

## 2021-01-27 NOTE — Telephone Encounter (Signed)
Spoke to pt and informed her that her labs showed she has a UTI and that we sent Macrobid to the pharmacy.  Pt also informed me that she was seen on 01/19/21 and on Thursday she started having symptoms and went Friday 01/23/21 and got tested for Covid and she was positive.  Pt states she will see how she feels and if she feels she needs medication she will call us back to let us know.

## 2021-02-10 DIAGNOSIS — E782 Mixed hyperlipidemia: Secondary | ICD-10-CM | POA: Diagnosis not present

## 2021-02-10 DIAGNOSIS — E559 Vitamin D deficiency, unspecified: Secondary | ICD-10-CM | POA: Diagnosis not present

## 2021-02-10 DIAGNOSIS — R5383 Other fatigue: Secondary | ICD-10-CM | POA: Diagnosis not present

## 2021-02-11 LAB — COMPREHENSIVE METABOLIC PANEL
ALT: 11 IU/L (ref 0–32)
AST: 19 IU/L (ref 0–40)
Albumin/Globulin Ratio: 1.4 (ref 1.2–2.2)
Albumin: 4.3 g/dL (ref 3.7–4.7)
Alkaline Phosphatase: 99 IU/L (ref 44–121)
BUN/Creatinine Ratio: 13 (ref 12–28)
BUN: 13 mg/dL (ref 8–27)
Bilirubin Total: 0.4 mg/dL (ref 0.0–1.2)
CO2: 24 mmol/L (ref 20–29)
Calcium: 9.8 mg/dL (ref 8.7–10.3)
Chloride: 97 mmol/L (ref 96–106)
Creatinine, Ser: 1.03 mg/dL — ABNORMAL HIGH (ref 0.57–1.00)
Globulin, Total: 3 g/dL (ref 1.5–4.5)
Glucose: 104 mg/dL — ABNORMAL HIGH (ref 70–99)
Potassium: 4.1 mmol/L (ref 3.5–5.2)
Sodium: 136 mmol/L (ref 134–144)
Total Protein: 7.3 g/dL (ref 6.0–8.5)
eGFR: 55 mL/min/{1.73_m2} — ABNORMAL LOW (ref >59)

## 2021-02-11 LAB — LIPID PANEL WITH LDL/HDL RATIO
Cholesterol, Total: 186 mg/dL (ref 100–199)
HDL: 53 mg/dL (ref >39)
LDL Chol Calc (NIH): 116 mg/dL — ABNORMAL HIGH (ref 0–99)
LDL/HDL Ratio: 2.2 ratio (ref 0.0–3.2)
Triglycerides: 92 mg/dL (ref 0–149)
VLDL Cholesterol Cal: 17 mg/dL (ref 5–40)

## 2021-02-11 LAB — CBC WITH DIFFERENTIAL/PLATELET
Basophils Absolute: 0 10*3/uL (ref 0.0–0.2)
Basos: 0 %
EOS (ABSOLUTE): 0.1 10*3/uL (ref 0.0–0.4)
Eos: 1 %
Hematocrit: 39.9 % (ref 34.0–46.6)
Hemoglobin: 13.4 g/dL (ref 11.1–15.9)
Immature Grans (Abs): 0 10*3/uL (ref 0.0–0.1)
Immature Granulocytes: 0 %
Lymphocytes Absolute: 1.3 10*3/uL (ref 0.7–3.1)
Lymphs: 27 %
MCH: 30.8 pg (ref 26.6–33.0)
MCHC: 33.6 g/dL (ref 31.5–35.7)
MCV: 92 fL (ref 79–97)
Monocytes Absolute: 0.4 10*3/uL (ref 0.1–0.9)
Monocytes: 8 %
Neutrophils Absolute: 3 10*3/uL (ref 1.4–7.0)
Neutrophils: 64 %
Platelets: 169 10*3/uL (ref 150–450)
RBC: 4.35 x10E6/uL (ref 3.77–5.28)
RDW: 12.8 % (ref 11.7–15.4)
WBC: 4.8 10*3/uL (ref 3.4–10.8)

## 2021-02-11 LAB — VITAMIN D 25 HYDROXY (VIT D DEFICIENCY, FRACTURES): Vit D, 25-Hydroxy: 32.2 ng/mL (ref 30.0–100.0)

## 2021-02-11 LAB — TSH+FREE T4
Free T4: 1.04 ng/dL (ref 0.82–1.77)
TSH: 1.85 u[IU]/mL (ref 0.450–4.500)

## 2021-03-11 ENCOUNTER — Other Ambulatory Visit: Payer: Self-pay | Admitting: Nurse Practitioner

## 2021-03-11 ENCOUNTER — Other Ambulatory Visit: Payer: Self-pay | Admitting: Internal Medicine

## 2021-03-11 DIAGNOSIS — E782 Mixed hyperlipidemia: Secondary | ICD-10-CM

## 2021-03-11 DIAGNOSIS — I10 Essential (primary) hypertension: Secondary | ICD-10-CM

## 2021-03-19 ENCOUNTER — Other Ambulatory Visit: Payer: Self-pay

## 2021-03-19 ENCOUNTER — Ambulatory Visit
Admission: RE | Admit: 2021-03-19 | Discharge: 2021-03-19 | Disposition: A | Discharge status: Home / Self Care | Payer: PPO | Source: Ambulatory Visit | Attending: Physician Assistant | Admitting: Physician Assistant

## 2021-03-19 DIAGNOSIS — Z1231 Encounter for screening mammogram for malignant neoplasm of breast: Secondary | ICD-10-CM | POA: Diagnosis not present

## 2021-04-20 ENCOUNTER — Ambulatory Visit: Payer: PPO | Admitting: Physician Assistant

## 2021-04-20 DIAGNOSIS — Z0289 Encounter for other administrative examinations: Secondary | ICD-10-CM

## 2021-05-07 ENCOUNTER — Encounter: Payer: Self-pay | Admitting: Physician Assistant

## 2021-05-07 ENCOUNTER — Ambulatory Visit (INDEPENDENT_AMBULATORY_CARE_PROVIDER_SITE_OTHER): Payer: PPO | Admitting: Physician Assistant

## 2021-05-07 ENCOUNTER — Other Ambulatory Visit: Payer: Self-pay

## 2021-05-07 DIAGNOSIS — K219 Gastro-esophageal reflux disease without esophagitis: Secondary | ICD-10-CM

## 2021-05-07 DIAGNOSIS — I10 Essential (primary) hypertension: Secondary | ICD-10-CM | POA: Diagnosis not present

## 2021-05-07 DIAGNOSIS — E782 Mixed hyperlipidemia: Secondary | ICD-10-CM

## 2021-05-07 MED ORDER — SIMVASTATIN 20 MG PO TABS: 20.0000 mg | ORAL_TABLET | Freq: Every day | ORAL | 3 refills | Status: DC

## 2021-05-07 NOTE — Progress Notes (Signed)
[facility] [place], [place] [address]  Internal MEDICINE  Office Visit Note  Patient Name: Rachael Knapp  [id number]  [id number]  Date of Service: 05/08/2021  Chief Complaint  Patient presents with   Follow-up   Hyperlipidemia   Hypertension   Gastroesophageal Reflux    HPI Pt is here for routine follow up -Sleeping ok -Healthy appetite -GERD is well controlled, only taking prevacid as needed -LDL elevated, but is improved and continues to take simvastatin -other labs looked good other than slightly abnormal kidney function but barely abnormal and had a UTI at the time. -Had her mammogram which was normal -Bp slightly elevated in office, but reports it is normall good at home. Her HR continues to be low, but is consistent and likely secondary to BB use. She is asymptomatic, therefore will continue to monitor and consider decreasing BB in future if needed.  Current Medication: Outpatient Encounter Medications as of 05/07/2021  Medication Sig   bisoprolol (ZEBETA) 5 MG tablet TAKE 1 TABLET BY MOUTH DAILY.   CHLORPHENIRAMINE-PHENYLEPHRINE PO Take by mouth.   diclofenac Sodium (VOLTAREN) 1 % GEL Apply 4 g topically 4 (four) times daily.   lansoprazole (PREVACID) 30 MG capsule Take 1 capsule (30 mg total) by mouth daily at 12 noon.   losartan-hydrochlorothiazide (HYZAAR) 100-25 MG tablet TAKE 1 TABLET BY MOUTH DAILY   nitrofurantoin, macrocrystal-monohydrate, (MACROBID) 100 MG capsule Take 1 cap twice per day for 10 days.   [DISCONTINUED] simvastatin (ZOCOR) 20 MG tablet Take 1 tablet (20 mg total) by mouth daily at 6 PM.   simvastatin (ZOCOR) 20 MG tablet Take 1 tablet (20 mg total) by mouth daily at 6 PM.   No facility-administered encounter medications on file as of 05/07/2021.    Surgical History: Past Surgical History:  Procedure Laterality Date   ABDOMINAL HYSTERECTOMY     APPENDECTOMY     CHOLECYSTECTOMY     COLONOSCOPY WITH PROPOFOL  N/A 05/15/2018   Procedure: COLONOSCOPY WITH PROPOFOL;  Surgeon: Lollie Sails, MD;  Location: [facility] ENDOSCOPY;  Service: Endoscopy;  Laterality: N/A;   HERNIA REPAIR     KNEE ARTHROSCOPY      Medical History: Past Medical History:  Diagnosis Date   GERD (gastroesophageal reflux disease)    Hyperlipemia    Hypertension     Family History: Family History  Problem Relation Age of Onset   Cancer Father    Hypertension Sister    Cancer Sister    Hypertension Brother    Cancer Brother    Cancer Brother    Diabetes Sister    Parkinson's disease Brother    Breast cancer Neg Hx     Social History   Socioeconomic History   Marital status: Widowed    Spouse name: Not on file   Number of children: Not on file   Years of education: Not on file   Highest education level: Not on file  Occupational History   Not on file  Tobacco Use   Smoking status: Never   Smokeless tobacco: Never  Vaping Use   Vaping Use: Never used  Substance and Sexual Activity   Alcohol use: No   Drug use: Never   Sexual activity: Not on file  Other Topics Concern   Not on file  Social History Narrative   Not on file   Social Determinants of Health   Financial Resource Strain: Not on file  Food Insecurity: Not on file  Transportation Needs: Not  on file  Physical Activity: Not on file  Stress: Not on file  Social Connections: Not on file  Intimate Partner Violence: Not on file      Review of Systems  Constitutional:  Negative for chills, fatigue and unexpected weight change.  HENT:  Negative for congestion, postnasal drip, rhinorrhea, sneezing and sore throat.   Eyes:  Negative for redness.  Respiratory:  Negative for cough, chest tightness and shortness of breath.   Cardiovascular:  Negative for chest pain and palpitations.  Gastrointestinal:  Negative for abdominal pain, constipation, diarrhea, nausea and vomiting.  Genitourinary:  Negative for dysuria and frequency.   Musculoskeletal:  Positive for arthralgias. Negative for back pain, joint swelling and neck pain.       L knee pain  Skin:  Negative for rash.  Neurological: Negative.  Negative for tremors and numbness.  Hematological:  Negative for adenopathy. Does not bruise/bleed easily.  Psychiatric/Behavioral:  Negative for behavioral problems (Depression), sleep disturbance and suicidal ideas. The patient is not nervous/anxious.    Vital Signs: BP (!) 144/76 Comment: 146/67   Pulse (!) 51    Temp 98.1 F (36.7 C)    Resp 16    Ht 5\' 6"  (1.676 m)    Wt 157 lb (71.2 kg)    SpO2 99%    BMI 25.34 kg/m    Physical Exam Vitals and nursing note reviewed.  Constitutional:      General: She is not in acute distress.    Appearance: Normal appearance. She is well-developed. She is not diaphoretic.  HENT:     Head: Normocephalic and atraumatic.     Nose: Nose normal.     Mouth/Throat:     Pharynx: No oropharyngeal exudate.  Eyes:     Pupils: Pupils are equal, round, and reactive to light.  Neck:     Thyroid: No thyromegaly.     Vascular: No carotid bruit or JVD.     Trachea: No tracheal deviation.  Cardiovascular:     Rate and Rhythm: Regular rhythm. Bradycardia present.     Pulses: Normal pulses.     Heart sounds:    No friction rub. No gallop.  Pulmonary:     Effort: Pulmonary effort is normal. No respiratory distress.     Breath sounds: Normal breath sounds. No wheezing or rales.  Chest:     Chest wall: No tenderness.  Abdominal:     General: Bowel sounds are normal.     Palpations: Abdomen is soft.     Tenderness: There is no abdominal tenderness.  Musculoskeletal:        General: Normal range of motion.     Cervical back: Normal range of motion and neck supple.  Lymphadenopathy:     Cervical: No cervical adenopathy.  Skin:    General: Skin is warm and dry.  Neurological:     General: No focal deficit present.     Mental Status: She is alert and oriented to person, place, and  time.     Cranial Nerves: No cranial nerve deficit.  Psychiatric:        Mood and Affect: Mood normal.        Behavior: Behavior normal.        Thought Content: Thought content normal.        Judgment: Judgment normal.       Assessment/Plan: 1. Essential hypertension Borderline BP in office with continues asymptomatic bradycardia, will continue current medications though may need to d/c BB and  add alternative if symptoms arise  2. Mixed hyperlipidemia Labs improved, continue simvastatin - simvastatin (ZOCOR) 20 MG tablet; Take 1 tablet (20 mg total) by mouth daily at 6 PM.  Dispense: 90 tablet; Refill: 3  3. Gastroesophageal reflux disease without esophagitis Stable, may continue prevacid only as needed    General Counseling: [name] understanding of the findings of todays visit and agrees with plan of treatment. I have discussed any further diagnostic evaluation that may be needed or ordered today. We also reviewed her medications today. she has been encouraged to call the office with any questions or concerns that should arise related to todays visit.    No orders of the defined types were placed in this encounter.   Meds ordered this encounter  Medications   simvastatin (ZOCOR) 20 MG tablet    Sig: Take 1 tablet (20 mg total) by mouth daily at 6 PM.    Dispense:  90 tablet    Refill:  3    This patient was seen by Drema Dallas, PA-C in collaboration with Dr. Clayborn Bigness as a part of collaborative care agreement.   Total time spent:30 Minutes Time spent includes review of chart, medications, test results, and follow up plan with the patient.      Dr Lavera Guise Internal medicine

## 2021-05-14 ENCOUNTER — Other Ambulatory Visit: Payer: Self-pay

## 2021-05-14 DIAGNOSIS — I10 Essential (primary) hypertension: Secondary | ICD-10-CM

## 2021-05-14 MED ORDER — LOSARTAN POTASSIUM-HCTZ 100-25 MG PO TABS: 1.0000 | ORAL_TABLET | Freq: Every day | ORAL | 3 refills | Status: DC

## 2021-09-03 ENCOUNTER — Ambulatory Visit: Payer: PPO | Admitting: Physician Assistant

## 2021-12-05 DIAGNOSIS — I10 Essential (primary) hypertension: Secondary | ICD-10-CM | POA: Diagnosis not present

## 2021-12-05 DIAGNOSIS — K219 Gastro-esophageal reflux disease without esophagitis: Secondary | ICD-10-CM | POA: Diagnosis not present

## 2021-12-05 DIAGNOSIS — I739 Peripheral vascular disease, unspecified: Secondary | ICD-10-CM | POA: Diagnosis not present

## 2021-12-05 DIAGNOSIS — E785 Hyperlipidemia, unspecified: Secondary | ICD-10-CM | POA: Diagnosis not present

## 2021-12-05 DIAGNOSIS — J309 Allergic rhinitis, unspecified: Secondary | ICD-10-CM | POA: Diagnosis not present

## 2021-12-05 DIAGNOSIS — E663 Overweight: Secondary | ICD-10-CM | POA: Diagnosis not present

## 2022-01-14 ENCOUNTER — Other Ambulatory Visit: Payer: Self-pay | Admitting: Physician Assistant

## 2022-01-14 DIAGNOSIS — E782 Mixed hyperlipidemia: Secondary | ICD-10-CM

## 2022-01-14 DIAGNOSIS — I10 Essential (primary) hypertension: Secondary | ICD-10-CM

## 2022-01-21 ENCOUNTER — Ambulatory Visit (INDEPENDENT_AMBULATORY_CARE_PROVIDER_SITE_OTHER): Payer: PPO | Admitting: Physician Assistant

## 2022-01-21 ENCOUNTER — Encounter: Payer: Self-pay | Admitting: Physician Assistant

## 2022-01-21 VITALS — BP 123/80 | HR 53 | Temp 97.8°F | Resp 16 | Ht 66.0 in | Wt 159.0 lb

## 2022-01-21 DIAGNOSIS — Z0001 Encounter for general adult medical examination with abnormal findings: Secondary | ICD-10-CM | POA: Diagnosis not present

## 2022-01-21 DIAGNOSIS — I340 Nonrheumatic mitral (valve) insufficiency: Secondary | ICD-10-CM

## 2022-01-21 DIAGNOSIS — R001 Bradycardia, unspecified: Secondary | ICD-10-CM | POA: Diagnosis not present

## 2022-01-21 DIAGNOSIS — R300 Dysuria: Secondary | ICD-10-CM

## 2022-01-21 DIAGNOSIS — I10 Essential (primary) hypertension: Secondary | ICD-10-CM | POA: Diagnosis not present

## 2022-01-21 DIAGNOSIS — E782 Mixed hyperlipidemia: Secondary | ICD-10-CM | POA: Diagnosis not present

## 2022-01-21 DIAGNOSIS — M7989 Other specified soft tissue disorders: Secondary | ICD-10-CM | POA: Diagnosis not present

## 2022-01-21 NOTE — Progress Notes (Signed)
[facility] [place], [place] [address]  Internal MEDICINE  Office Visit Note  Patient Name: Rachael Knapp  [id number]  [id number]  Date of Service: 01/27/2022  Chief Complaint  Patient presents with   Medicare Wellness   Hyperlipidemia   Hypertension     HPI Pt is here for routine health maintenance examination -She is doing well today -She does have a bump along edge of right clavicle that she noticed about a month ago. It is not painful or bothersome, but is curious about why it appeared. Will order US of the area. -HR low on exam, unfortunately due Internet outage/technical issue at time of appt an EKG could not be done, but pt denies any dizziness or light headedness.Also denies any SOB or CP. It has been low previously and may be secondary to BB. Will d/c bisoprolol ad have pt monitor BP. -Did discuss pt option to discontinue mammograms and she is ok with this and will call office if anything changes  Current Medication: Outpatient Encounter Medications as of 01/21/2022  Medication Sig   bisoprolol (ZEBETA) 5 MG tablet TAKE 1 TABLET BY MOUTH DAILY.   CHLORPHENIRAMINE-PHENYLEPHRINE PO Take by mouth.   diclofenac Sodium (VOLTAREN) 1 % GEL Apply 4 g topically 4 (four) times daily.   lansoprazole (PREVACID) 30 MG capsule Take 1 capsule (30 mg total) by mouth daily at 12 noon.   losartan-hydrochlorothiazide (HYZAAR) 100-25 MG tablet TAKE 1 TABLET BY MOUTH ONCE DAILY   simvastatin (ZOCOR) 20 MG tablet Take 1 tablet (20 mg total) by mouth daily at 6 PM.   [DISCONTINUED] nitrofurantoin, macrocrystal-monohydrate, (MACROBID) 100 MG capsule Take 1 cap twice per day for 10 days.   No facility-administered encounter medications on file as of 01/21/2022.    Surgical History: Past Surgical History:  Procedure Laterality Date   ABDOMINAL HYSTERECTOMY     APPENDECTOMY     CHOLECYSTECTOMY     COLONOSCOPY WITH PROPOFOL N/A 05/15/2018   Procedure:  COLONOSCOPY WITH PROPOFOL;  Surgeon: Lollie Sails, MD;  Location: [facility] ENDOSCOPY;  Service: Endoscopy;  Laterality: N/A;   HERNIA REPAIR     KNEE ARTHROSCOPY      Medical History: Past Medical History:  Diagnosis Date   GERD (gastroesophageal reflux disease)    Hyperlipemia    Hypertension     Family History: Family History  Problem Relation Age of Onset   Cancer Father    Hypertension Sister    Cancer Sister    Hypertension Brother    Cancer Brother    Cancer Brother    Diabetes Sister    Parkinson's disease Brother    Breast cancer Neg Hx       Review of Systems  Constitutional:  Negative for chills, fatigue and unexpected weight change.  HENT:  Negative for congestion, postnasal drip, rhinorrhea, sneezing and sore throat.   Eyes:  Negative for redness.  Respiratory:  Negative for cough, chest tightness and shortness of breath.   Cardiovascular:  Negative for chest pain and palpitations.  Gastrointestinal:  Negative for abdominal pain, constipation, diarrhea, nausea and vomiting.  Genitourinary:  Negative for dysuria and frequency.  Musculoskeletal:  Positive for arthralgias. Negative for back pain, joint swelling and neck pain.  Skin:  Negative for rash.       Bump along base of neck over collar bone  Neurological: Negative.  Negative for tremors and numbness.  Hematological:  Negative for adenopathy. Does not bruise/bleed easily.  Psychiatric/Behavioral:  Negative for  behavioral problems (Depression), sleep disturbance and suicidal ideas. The patient is not nervous/anxious.      Vital Signs: BP 123/80   Pulse (!) 53   Temp 97.8 F (36.6 C)   Resp 16   Ht '5\' 6"'$  (1.676 m)   Wt 159 lb (72.1 kg)   SpO2 97%   BMI 25.66 kg/m    Physical Exam Vitals and nursing note reviewed.  Constitutional:      General: She is not in acute distress.    Appearance: Normal appearance. She is well-developed. She is not diaphoretic.  HENT:     Head: Normocephalic and  atraumatic.     Nose: Nose normal.     Mouth/Throat:     Pharynx: No oropharyngeal exudate.  Eyes:     Pupils: Pupils are equal, round, and reactive to light.  Neck:     Thyroid: No thyromegaly.     Vascular: No carotid bruit or JVD.     Trachea: No tracheal deviation.  Cardiovascular:     Rate and Rhythm: Regular rhythm. Bradycardia present.     Pulses: Normal pulses.     Heart sounds:     No friction rub. No gallop.  Pulmonary:     Effort: Pulmonary effort is normal. No respiratory distress.     Breath sounds: Normal breath sounds. No wheezing or rales.  Chest:     Chest wall: No tenderness.  Abdominal:     General: Bowel sounds are normal.     Palpations: Abdomen is soft.     Tenderness: There is no abdominal tenderness.  Musculoskeletal:        General: Normal range of motion.     Cervical back: Normal range of motion and neck supple.     Comments: Possible soft tissue mass overlying right Newberry joint  Lymphadenopathy:     Cervical: No cervical adenopathy.  Skin:    General: Skin is warm and dry.  Neurological:     General: No focal deficit present.     Mental Status: She is alert and oriented to person, place, and time.     Cranial Nerves: No cranial nerve deficit.  Psychiatric:        Mood and Affect: Mood normal.        Behavior: Behavior normal.        Thought Content: Thought content normal.        Judgment: Judgment normal.      LABS: Recent Results (from the past 2160 hour(s))  UA/M w/rflx Culture, Routine     Status: None   Collection Time: 01/21/22  3:48 PM   Specimen: Urine   Urine  Result Value Ref Range   Specific Gravity, UA 1.009 1.005 - 1.030   pH, UA 6.5 5.0 - 7.5   Color, UA Yellow Yellow   Appearance Ur Clear Clear   Leukocytes,UA Negative Negative   Protein,UA Negative Negative/Trace   Glucose, UA Negative Negative   Ketones, UA Negative Negative   RBC, UA Negative Negative   Bilirubin, UA Negative Negative   Urobilinogen, Ur 0.2 0.2 -  1.0 mg/dL   Nitrite, UA Negative Negative   Microscopic Examination Comment     Comment: Microscopic follows if indicated.   Microscopic Examination See below:     Comment: Microscopic was indicated and was performed.   Urinalysis Reflex Comment     Comment: This specimen will not reflex to a Urine Culture.  Microscopic Examination     Status: None  Collection Time: 01/21/22  3:48 PM   Urine  Result Value Ref Range   WBC, UA None seen 0 - 5 /hpf   RBC, Urine None seen 0 - 2 /hpf   Epithelial Cells (non renal) None seen 0 - 10 /hpf   Casts None seen None seen /lpf   Bacteria, UA None seen None seen/Few        Assessment/Plan: 1. Encounter for general adult medical examination with abnormal findings CPE performed  2. Essential hypertension Will d/c bisoprolol secondary to bradycardia and will monitor BP on hyzaar  3. Mild mitral regurgitation Continue to monitor  4. Bradycardia Will d/c bisoprolol and monitor. Pt denies any symptoms of bradycardia  5. Mass of soft tissue of neck - US Soft Tissue Head/Neck (NON-THYROID); Future  6. Mixed hyperlipidemia Continue simvastatin  7. Dysuria - UA/M w/rflx Culture, Routine   General Counseling: [name] understanding of the findings of todays visit and agrees with plan of treatment. I have discussed any further diagnostic evaluation that may be needed or ordered today. We also reviewed her medications today. she has been encouraged to call the office with any questions or concerns that should arise related to todays visit.    Counseling:    Orders Placed This Encounter  Procedures   Microscopic Examination   US Soft Tissue Head/Neck (NON-THYROID)   UA/M w/rflx Culture, Routine    No orders of the defined types were placed in this encounter.   This patient was seen by Drema Dallas, PA-C in collaboration with Dr. Clayborn Bigness as a part of collaborative care agreement.  Total time spent:40  Minutes  Time spent includes review of chart, medications, test results, and follow up plan with the patient.     Lavera Guise, MD  Internal Medicine

## 2022-01-22 LAB — MICROSCOPIC EXAMINATION
Bacteria, UA: NONE SEEN
Casts: NONE SEEN /lpf
Epithelial Cells (non renal): NONE SEEN /hpf (ref 0–10)
RBC, Urine: NONE SEEN /hpf (ref 0–2)
WBC, UA: NONE SEEN /hpf (ref 0–5)

## 2022-01-22 LAB — UA/M W/RFLX CULTURE, ROUTINE
Bilirubin, UA: NEGATIVE
Glucose, UA: NEGATIVE
Ketones, UA: NEGATIVE
Leukocytes,UA: NEGATIVE
Nitrite, UA: NEGATIVE
Protein,UA: NEGATIVE
RBC, UA: NEGATIVE
Specific Gravity, UA: 1.009 (ref 1.005–1.030)
Urobilinogen, Ur: 0.2 mg/dL (ref 0.2–1.0)
pH, UA: 6.5 (ref 5.0–7.5)

## 2022-02-08 ENCOUNTER — Ambulatory Visit: Payer: PPO

## 2022-02-08 DIAGNOSIS — M7989 Other specified soft tissue disorders: Secondary | ICD-10-CM

## 2022-02-18 ENCOUNTER — Encounter: Payer: Self-pay | Admitting: Physician Assistant

## 2022-02-18 ENCOUNTER — Ambulatory Visit (INDEPENDENT_AMBULATORY_CARE_PROVIDER_SITE_OTHER): Payer: PPO | Admitting: Physician Assistant

## 2022-02-18 DIAGNOSIS — I10 Essential (primary) hypertension: Secondary | ICD-10-CM | POA: Diagnosis not present

## 2022-02-18 DIAGNOSIS — E559 Vitamin D deficiency, unspecified: Secondary | ICD-10-CM | POA: Diagnosis not present

## 2022-02-18 DIAGNOSIS — K219 Gastro-esophageal reflux disease without esophagitis: Secondary | ICD-10-CM

## 2022-02-18 DIAGNOSIS — E782 Mixed hyperlipidemia: Secondary | ICD-10-CM

## 2022-02-18 DIAGNOSIS — R001 Bradycardia, unspecified: Secondary | ICD-10-CM

## 2022-02-18 DIAGNOSIS — R5383 Other fatigue: Secondary | ICD-10-CM

## 2022-02-18 DIAGNOSIS — E538 Deficiency of other specified B group vitamins: Secondary | ICD-10-CM | POA: Diagnosis not present

## 2022-02-18 MED ORDER — LANSOPRAZOLE 30 MG PO CPDR: 30.0000 mg | DELAYED_RELEASE_CAPSULE | Freq: Every day | ORAL | 2 refills | Status: DC

## 2022-02-18 MED ORDER — AMLODIPINE BESYLATE 2.5 MG PO TABS: 2.5000 mg | ORAL_TABLET | Freq: Every day | ORAL | 2 refills | Status: DC

## 2022-02-18 NOTE — Progress Notes (Signed)
[facility] [place], [place] [address]  Internal MEDICINE  Office Visit Note  Patient Name: Rachael Knapp  [id number]  [id number]  Date of Service: 02/18/2022  Chief Complaint  Patient presents with   Follow-up    Reviewing ultrasound   Gastroesophageal Reflux   Hypertension   Hyperlipidemia   Quality Metric Gaps    Shingles and Tetanus Vaccines    HPI Pt is here for routine follow up to review [place] -Did stop bisoprolol due to bradycardia last visit. HR is still on the low side but improved from last visit. Denies any symptoms from this. No dizziness or lightheadedness. No SOB or CP. -BP is up since stopping bisoprolol though. Is taking hyzaar. Will add low dose amlodipine and advised to monitor at home -soft tissue US along clavicle/base of neck did show what is likely a small lymph node. She denies being bothered by area and hasnt changed any -sometimes forgets simvastatin and will try to do better with this -Due for labs  Current Medication: Outpatient Encounter Medications as of 02/18/2022  Medication Sig   amLODipine (NORVASC) 2.5 MG tablet Take 1 tablet (2.5 mg total) by mouth daily.   CHLORPHENIRAMINE-PHENYLEPHRINE PO Take by mouth.   diclofenac Sodium (VOLTAREN) 1 % GEL Apply 4 g topically 4 (four) times daily.   losartan-hydrochlorothiazide (HYZAAR) 100-25 MG tablet TAKE 1 TABLET BY MOUTH ONCE DAILY   simvastatin (ZOCOR) 20 MG tablet Take 1 tablet (20 mg total) by mouth daily at 6 PM.   [DISCONTINUED] bisoprolol (ZEBETA) 5 MG tablet TAKE 1 TABLET BY MOUTH DAILY.   [DISCONTINUED] lansoprazole (PREVACID) 30 MG capsule Take 1 capsule (30 mg total) by mouth daily at 12 noon.   lansoprazole (PREVACID) 30 MG capsule Take 1 capsule (30 mg total) by mouth daily at 12 noon.   No facility-administered encounter medications on file as of 02/18/2022.    Surgical History: Past Surgical History:  Procedure Laterality Date   ABDOMINAL  HYSTERECTOMY     APPENDECTOMY     CHOLECYSTECTOMY     COLONOSCOPY WITH PROPOFOL N/A 05/15/2018   Procedure: COLONOSCOPY WITH PROPOFOL;  Surgeon: Lollie Sails, MD;  Location: [facility] ENDOSCOPY;  Service: Endoscopy;  Laterality: N/A;   HERNIA REPAIR     KNEE ARTHROSCOPY      Medical History: Past Medical History:  Diagnosis Date   GERD (gastroesophageal reflux disease)    Hyperlipemia    Hypertension     Family History: Family History  Problem Relation Age of Onset   Cancer Father    Hypertension Sister    Cancer Sister    Hypertension Brother    Cancer Brother    Cancer Brother    Diabetes Sister    Parkinson's disease Brother    Breast cancer Neg Hx     Social History   Socioeconomic History   Marital status: Widowed    Spouse name: Not on file   Number of children: Not on file   Years of education: Not on file   Highest education level: Not on file  Occupational History   Not on file  Tobacco Use   Smoking status: Never   Smokeless tobacco: Never  Vaping Use   Vaping Use: Never used  Substance and Sexual Activity   Alcohol use: No   Drug use: Never   Sexual activity: Not on file  Other Topics Concern   Not on file  Social History Narrative   Not on file  Social Determinants of Health   Financial Resource Strain: Not on file  Food Insecurity: Not on file  Transportation Needs: Not on file  Physical Activity: Not on file  Stress: Not on file  Social Connections: Not on file  Intimate Partner Violence: Not on file      Review of Systems  Constitutional:  Negative for chills, fatigue and unexpected weight change.  HENT:  Negative for congestion, postnasal drip, rhinorrhea, sneezing and sore throat.   Eyes:  Negative for redness.  Respiratory:  Negative for cough, chest tightness and shortness of breath.   Cardiovascular:  Negative for chest pain and palpitations.  Gastrointestinal:  Negative for abdominal pain, constipation, diarrhea, nausea  and vomiting.  Genitourinary:  Negative for dysuria and frequency.  Musculoskeletal:  Positive for arthralgias. Negative for back pain, joint swelling and neck pain.       L knee pain  Skin:  Negative for rash.  Neurological: Negative.  Negative for tremors and numbness.  Hematological:  Negative for adenopathy. Does not bruise/bleed easily.  Psychiatric/Behavioral:  Negative for behavioral problems (Depression), sleep disturbance and suicidal ideas. The patient is not nervous/anxious.     Vital Signs: BP (!) 148/82 Comment: 167/76  Pulse (!) 59   Temp 98.2 F (36.8 C)   Resp 16   Ht '5\' 6"'$  (1.676 m)   Wt 159 lb 3.2 oz (72.2 kg)   SpO2 97%   BMI 25.70 kg/m    Physical Exam Vitals and nursing note reviewed.  Constitutional:      General: She is not in acute distress.    Appearance: Normal appearance. She is well-developed. She is not diaphoretic.  HENT:     Head: Normocephalic and atraumatic.     Nose: Nose normal.     Mouth/Throat:     Pharynx: No oropharyngeal exudate.  Eyes:     Pupils: Pupils are equal, round, and reactive to light.  Neck:     Thyroid: No thyromegaly.     Vascular: No carotid bruit or JVD.     Trachea: No tracheal deviation.  Cardiovascular:     Rate and Rhythm: Regular rhythm. Bradycardia present.     Pulses: Normal pulses.     Heart sounds:     No friction rub. No gallop.  Pulmonary:     Effort: Pulmonary effort is normal. No respiratory distress.     Breath sounds: Normal breath sounds. No wheezing or rales.  Chest:     Chest wall: No tenderness.  Abdominal:     General: Bowel sounds are normal.     Palpations: Abdomen is soft.     Tenderness: There is no abdominal tenderness.  Musculoskeletal:        General: Normal range of motion.     Cervical back: Normal range of motion and neck supple.  Lymphadenopathy:     Cervical: No cervical adenopathy.  Skin:    General: Skin is warm and dry.  Neurological:     General: No focal deficit  present.     Mental Status: She is alert and oriented to person, place, and time.     Cranial Nerves: No cranial nerve deficit.  Psychiatric:        Mood and Affect: Mood normal.        Behavior: Behavior normal.        Thought Content: Thought content normal.        Judgment: Judgment normal.        Assessment/Plan: 1. Essential  hypertension We will continue Hyzaar and will add low-dose amlodipine to help with lowering blood pressure without significant impact on heart rate.  Advised patient to monitor at home - amLODipine (NORVASC) 2.5 MG tablet; Take 1 tablet (2.5 mg total) by mouth daily.  Dispense: 30 tablet; Refill: 2  2. Bradycardia Improved since stopping bisoprolol and patient is asymptomatic.  We will continue to monitor  3. Gastroesophageal reflux disease without esophagitis - lansoprazole (PREVACID) 30 MG capsule; Take 1 capsule (30 mg total) by mouth daily at 12 noon.  Dispense: 20 capsule; Refill: 2  4. Mixed hyperlipidemia - Lipid Panel With LDL/HDL Ratio  5. Vitamin D deficiency - VITAMIN D 25 Hydroxy (Vit-D Deficiency, Fractures)  6. B12 deficiency - B12 and Folate Panel  7. Other fatigue - VITAMIN D 25 Hydroxy (Vit-D Deficiency, Fractures) - CBC w/Diff/Platelet - Comprehensive metabolic panel - TSH + free T4 - Lipid Panel With LDL/HDL Ratio   General Counseling: Acquanetta verbalizes understanding of the findings of todays visit and agrees with plan of treatment. I have discussed any further diagnostic evaluation that may be needed or ordered today. We also reviewed her medications today. she has been encouraged to call the office with any questions or concerns that should arise related to todays visit.    Orders Placed This Encounter  Procedures   VITAMIN D 25 Hydroxy (Vit-D Deficiency, Fractures)   CBC w/Diff/Platelet   Comprehensive metabolic panel   TSH + free T4   Lipid Panel With LDL/HDL Ratio   B12 and Folate Panel    Meds ordered this  encounter  Medications   lansoprazole (PREVACID) 30 MG capsule    Sig: Take 1 capsule (30 mg total) by mouth daily at 12 noon.    Dispense:  20 capsule    Refill:  2   amLODipine (NORVASC) 2.5 MG tablet    Sig: Take 1 tablet (2.5 mg total) by mouth daily.    Dispense:  30 tablet    Refill:  2    This patient was seen by Drema Dallas, PA-C in collaboration with Dr. Clayborn Bigness as a part of collaborative care agreement.   Total time spent:30 Minutes Time spent includes review of chart, medications, test results, and follow up plan with the patient.      Dr Lavera Guise Internal medicine

## 2022-02-22 ENCOUNTER — Other Ambulatory Visit: Payer: Self-pay | Admitting: Physician Assistant

## 2022-02-22 DIAGNOSIS — R5383 Other fatigue: Secondary | ICD-10-CM | POA: Diagnosis not present

## 2022-02-22 DIAGNOSIS — E782 Mixed hyperlipidemia: Secondary | ICD-10-CM | POA: Diagnosis not present

## 2022-02-22 DIAGNOSIS — I10 Essential (primary) hypertension: Secondary | ICD-10-CM

## 2022-02-22 DIAGNOSIS — E538 Deficiency of other specified B group vitamins: Secondary | ICD-10-CM | POA: Diagnosis not present

## 2022-02-22 DIAGNOSIS — E559 Vitamin D deficiency, unspecified: Secondary | ICD-10-CM | POA: Diagnosis not present

## 2022-02-23 LAB — CBC WITH DIFFERENTIAL/PLATELET
Basophils Absolute: 0 10*3/uL (ref 0.0–0.2)
Basos: 0 %
EOS (ABSOLUTE): 0.1 10*3/uL (ref 0.0–0.4)
Eos: 2 %
Hematocrit: 40.5 % (ref 34.0–46.6)
Hemoglobin: 13.8 g/dL (ref 11.1–15.9)
Immature Grans (Abs): 0 10*3/uL (ref 0.0–0.1)
Immature Granulocytes: 0 %
Lymphocytes Absolute: 1.4 10*3/uL (ref 0.7–3.1)
Lymphs: 38 %
MCH: 30.5 pg (ref 26.6–33.0)
MCHC: 34.1 g/dL (ref 31.5–35.7)
MCV: 90 fL (ref 79–97)
Monocytes Absolute: 0.3 10*3/uL (ref 0.1–0.9)
Monocytes: 8 %
Neutrophils Absolute: 1.8 10*3/uL (ref 1.4–7.0)
Neutrophils: 52 %
Platelets: 149 10*3/uL — ABNORMAL LOW (ref 150–450)
RBC: 4.52 x10E6/uL (ref 3.77–5.28)
RDW: 12.4 % (ref 11.7–15.4)
WBC: 3.6 10*3/uL (ref 3.4–10.8)

## 2022-02-23 LAB — COMPREHENSIVE METABOLIC PANEL
ALT: 10 IU/L (ref 0–32)
AST: 20 IU/L (ref 0–40)
Albumin/Globulin Ratio: 1.7 (ref 1.2–2.2)
Albumin: 4.7 g/dL (ref 3.7–4.7)
Alkaline Phosphatase: 111 IU/L (ref 44–121)
BUN/Creatinine Ratio: 10 — ABNORMAL LOW (ref 12–28)
BUN: 9 mg/dL (ref 8–27)
Bilirubin Total: 0.4 mg/dL (ref 0.0–1.2)
CO2: 26 mmol/L (ref 20–29)
Calcium: 9.3 mg/dL (ref 8.7–10.3)
Chloride: 99 mmol/L (ref 96–106)
Creatinine, Ser: 0.89 mg/dL (ref 0.57–1.00)
Globulin, Total: 2.8 g/dL (ref 1.5–4.5)
Glucose: 103 mg/dL — ABNORMAL HIGH (ref 70–99)
Potassium: 3.7 mmol/L (ref 3.5–5.2)
Sodium: 138 mmol/L (ref 134–144)
Total Protein: 7.5 g/dL (ref 6.0–8.5)
eGFR: 65 mL/min/{1.73_m2} (ref >59)

## 2022-02-23 LAB — LIPID PANEL WITH LDL/HDL RATIO
Cholesterol, Total: 215 mg/dL — ABNORMAL HIGH (ref 100–199)
HDL: 70 mg/dL (ref >39)
LDL Chol Calc (NIH): 130 mg/dL — ABNORMAL HIGH (ref 0–99)
LDL/HDL Ratio: 1.9 ratio (ref 0.0–3.2)
Triglycerides: 85 mg/dL (ref 0–149)
VLDL Cholesterol Cal: 15 mg/dL (ref 5–40)

## 2022-02-23 LAB — VITAMIN D 25 HYDROXY (VIT D DEFICIENCY, FRACTURES): Vit D, 25-Hydroxy: 24 ng/mL — ABNORMAL LOW (ref 30.0–100.0)

## 2022-02-23 LAB — B12 AND FOLATE PANEL
Folate: 12.2 ng/mL (ref >3.0)
Vitamin B-12: 399 pg/mL (ref 232–1245)

## 2022-02-23 LAB — TSH+FREE T4
Free T4: 1 ng/dL (ref 0.82–1.77)
TSH: 3.11 u[IU]/mL (ref 0.450–4.500)

## 2022-03-18 ENCOUNTER — Ambulatory Visit: Payer: PPO | Admitting: Physician Assistant

## 2022-04-08 ENCOUNTER — Other Ambulatory Visit: Payer: Self-pay | Admitting: Physician Assistant

## 2022-04-08 DIAGNOSIS — I10 Essential (primary) hypertension: Secondary | ICD-10-CM

## 2022-05-20 ENCOUNTER — Other Ambulatory Visit: Payer: Self-pay | Admitting: Physician Assistant

## 2022-05-20 DIAGNOSIS — I10 Essential (primary) hypertension: Secondary | ICD-10-CM

## 2022-11-25 DIAGNOSIS — Z7901 Long term (current) use of anticoagulants: Secondary | ICD-10-CM | POA: Diagnosis not present

## 2022-11-25 DIAGNOSIS — Z7982 Long term (current) use of aspirin: Secondary | ICD-10-CM | POA: Diagnosis not present

## 2022-11-25 DIAGNOSIS — J439 Emphysema, unspecified: Secondary | ICD-10-CM | POA: Diagnosis not present

## 2022-11-25 DIAGNOSIS — K219 Gastro-esophageal reflux disease without esophagitis: Secondary | ICD-10-CM | POA: Diagnosis not present

## 2022-11-25 DIAGNOSIS — I10 Essential (primary) hypertension: Secondary | ICD-10-CM | POA: Diagnosis not present

## 2022-11-25 DIAGNOSIS — E785 Hyperlipidemia, unspecified: Secondary | ICD-10-CM | POA: Diagnosis not present

## 2023-01-04 ENCOUNTER — Other Ambulatory Visit: Payer: Self-pay | Admitting: Physician Assistant

## 2023-01-04 DIAGNOSIS — I10 Essential (primary) hypertension: Secondary | ICD-10-CM

## 2023-01-27 ENCOUNTER — Ambulatory Visit: Payer: PPO | Admitting: Physician Assistant

## 2023-02-07 ENCOUNTER — Ambulatory Visit: Payer: PPO | Admitting: Physician Assistant

## 2023-02-07 ENCOUNTER — Encounter: Payer: Self-pay | Admitting: Physician Assistant

## 2023-02-07 VITALS — BP 138/76 | HR 73 | Temp 98.3°F | Resp 16 | Ht 66.0 in | Wt 157.6 lb

## 2023-02-07 DIAGNOSIS — Z0001 Encounter for general adult medical examination with abnormal findings: Secondary | ICD-10-CM

## 2023-02-07 DIAGNOSIS — I10 Essential (primary) hypertension: Secondary | ICD-10-CM | POA: Diagnosis not present

## 2023-02-07 DIAGNOSIS — E782 Mixed hyperlipidemia: Secondary | ICD-10-CM

## 2023-02-07 MED ORDER — SIMVASTATIN 20 MG PO TABS: 20.0000 mg | ORAL_TABLET | Freq: Every day | ORAL | 3 refills | Status: DC

## 2023-02-07 MED ORDER — AMLODIPINE BESYLATE 2.5 MG PO TABS: 2.5000 mg | ORAL_TABLET | Freq: Every day | ORAL | 1 refills | Status: DC

## 2023-02-07 NOTE — Progress Notes (Signed)
[facility] [address] [place], [place] [address]  Internal MEDICINE  Office Visit Note  Patient Name: Rachael Knapp  [id number]  [id number]  Date of Service: 02/16/2023  Chief Complaint  Patient presents with   Medicare Wellness   Gastroesophageal Reflux   Hypertension   Hyperlipidemia   Quality Metric Gaps    TDAP    HPI Rachael Knapp presents for an annual well visit and physical exam.  Well-appearing 82 y.o.female -will think about shingles vaccine and tdap Labs: given lab slip New or worsening pain: left knee pain still, may follow back up with Dr. Ernest Pine who worked on her right knee -No checking Bp at home, went without amlodipine for awhile but taking nightly now     02/07/2023   10:50 AM 01/21/2022    2:03 PM 01/03/2020    3:20 PM  MMSE - Mini Mental State Exam  Orientation to time 5 5 5   Orientation to Place 5 5 5   Registration 3 3 3   Attention/ Calculation 5 5 5   Recall 3 3 3   Language- name 2 objects 2 2 2   Language- repeat 1 1 1   Language- follow 3 step command 3 3 3   Language- read & follow direction 1 1 1   Write a sentence 1 1 1   Copy design 1 1 1   Total score 30 30 30     Functional Status Survey: Is the patient deaf or have difficulty hearing?: 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 0 0 0 0 0  (RETIRED) Patient Fall Risk Level   Low fall risk    Patient at Risk for Falls Due to   No Fall Risks     Fall risk Follow up   Falls evaluation completed         02/07/2023   10:49 AM  Depression screen PHQ 2/9  Decreased Interest 0  Down, Depressed, Hopeless 0  PHQ - 2 Score 0        No data to display            Current Medication: Outpatient Encounter Medications as of 02/07/2023  Medication Sig   CHLORPHENIRAMINE-PHENYLEPHRINE PO Take by mouth.   diclofenac Sodium (VOLTAREN) 1 % GEL Apply 4 g topically 4 (four) times daily.   lansoprazole (PREVACID) 30 MG capsule Take 1 capsule (30 mg total) by mouth daily at 12 noon.   [DISCONTINUED] amLODipine (NORVASC) 2.5 MG tablet Take 1 tablet (2.5 mg total) by mouth daily.   [DISCONTINUED] losartan-hydrochlorothiazide (HYZAAR) 100-25 MG tablet TAKE 1 TABLET BY MOUTH DAILY   [DISCONTINUED] simvastatin (ZOCOR) 20 MG tablet Take 1 tablet (20 mg total) by mouth daily at 6 PM.   amLODipine (NORVASC) 2.5 MG tablet Take 1 tablet (2.5 mg total) by mouth daily.   simvastatin (ZOCOR) 20 MG tablet Take 1 tablet (20 mg total) by mouth daily at 6 PM.   No facility-administered encounter medications  on file as of 02/07/2023.    Surgical History: Past Surgical History:  Procedure Laterality Date   ABDOMINAL HYSTERECTOMY     APPENDECTOMY     CHOLECYSTECTOMY     COLONOSCOPY WITH PROPOFOL N/A 05/15/2018   Procedure: COLONOSCOPY WITH PROPOFOL;  Surgeon: Christena Deem, MD;  Location: [facility] ENDOSCOPY;  Service: Endoscopy;  Laterality: N/A;   HERNIA REPAIR     KNEE ARTHROSCOPY      Medical History: Past Medical History:  Diagnosis Date   GERD (gastroesophageal reflux disease)    Hyperlipemia    Hypertension     Family History: Family History  Problem Relation Age of Onset   Cancer Father    Hypertension Sister    Cancer Sister    Hypertension Brother    Cancer Brother    Cancer Brother    Diabetes Sister    Parkinson's disease Brother    Breast cancer Neg Hx     Social History   Socioeconomic History   Marital status:  Widowed    Spouse name: Not on file   Number of children: Not on file   Years of education: Not on file   Highest education level: Not on file  Occupational History   Not on file  Tobacco Use   Smoking status: Never   Smokeless tobacco: Never  Vaping Use   Vaping status: Never Used  Substance and Sexual Activity   Alcohol use: No   Drug use: Never   Sexual activity: Not on file  Other Topics Concern   Not on file  Social History Narrative   Not on file   Social Determinants of Health   Financial Resource Strain: Not on file  Food Insecurity: Not on file  Transportation Needs: Not on file  Physical Activity: Not on file  Stress: Not on file  Social Connections: Not on file  Intimate Partner Violence: Not on file      Review of Systems  Constitutional:  Negative for chills, fatigue and unexpected weight change.  HENT:  Negative for congestion, postnasal drip, rhinorrhea, sneezing and sore throat.   Eyes:  Negative for redness.  Respiratory:  Negative for cough, chest tightness and shortness of breath.   Cardiovascular:  Negative for chest pain and palpitations.  Gastrointestinal:  Negative for abdominal pain, constipation, diarrhea, nausea and vomiting.  Genitourinary:  Negative for dysuria and frequency.  Musculoskeletal:  Positive for arthralgias. Negative for back pain, joint swelling and neck pain.       L knee pain  Skin:  Negative for rash.  Neurological: Negative.  Negative for tremors and numbness.  Hematological:  Negative for adenopathy. Does not bruise/bleed easily.  Psychiatric/Behavioral:  Negative for behavioral problems (Depression), sleep disturbance and suicidal ideas. The patient is not nervous/anxious.     Vital Signs: BP 138/76 Comment: 150/90  Pulse 73   Temp 98.3 F (36.8 C)   Resp 16   Ht 5\' 6"  (1.676 m)   Wt 157 lb 9.6 oz (71.5 kg)   SpO2 97%   BMI 25.44 kg/m    Physical Exam Vitals and nursing note reviewed.  Constitutional:       General: She is not in acute distress.    Appearance: Normal appearance. She is well-developed. She is not diaphoretic.  HENT:     Head: Normocephalic and atraumatic.     Nose: Nose normal.     Mouth/Throat:     Pharynx: No oropharyngeal exudate.  Eyes:     Pupils:  Pupils are equal, round, and reactive to light.  Neck:     Thyroid: No thyromegaly.     Vascular: No carotid bruit or JVD.     Trachea: No tracheal deviation.  Cardiovascular:     Rate and Rhythm: Normal rate and regular rhythm.     Pulses: Normal pulses.     Heart sounds:     No friction rub. No gallop.  Pulmonary:     Effort: Pulmonary effort is normal. No respiratory distress.     Breath sounds: Normal breath sounds. No wheezing or rales.  Chest:     Chest wall: No tenderness.  Abdominal:     General: Bowel sounds are normal.     Palpations: Abdomen is soft.     Tenderness: There is no abdominal tenderness.  Musculoskeletal:        General: Normal range of motion.     Cervical back: Normal range of motion and neck supple.  Lymphadenopathy:     Cervical: No cervical adenopathy.  Skin:    General: Skin is warm and dry.  Neurological:     General: No focal deficit present.     Mental Status: She is alert and oriented to person, place, and time.     Cranial Nerves: No cranial nerve deficit.  Psychiatric:        Mood and Affect: Mood normal.        Behavior: Behavior normal.        Thought Content: Thought content normal.        Judgment: Judgment normal.        Assessment/Plan: 1. Encounter for general adult medical examination with abnormal findings Cpe performed, lab slip provided to do in future  2. Essential hypertension Stable. Continue current medication - amLODipine (NORVASC) 2.5 MG tablet; Take 1 tablet (2.5 mg total) by mouth daily.  Dispense: 90 tablet; Refill: 1  3. Mixed hyperlipidemia - simvastatin (ZOCOR) 20 MG tablet; Take 1 tablet (20 mg total) by mouth daily at 6 PM.  Dispense: 90  tablet; Refill: 3     General Counseling: Jacolyn Reedy understanding of the findings of todays visit and agrees with plan of treatment. I have discussed any further diagnostic evaluation that may be needed or ordered today. We also reviewed her medications today. she has been encouraged to call the office with any questions or concerns that should arise related to todays visit.    No orders of the defined types were placed in this encounter.   Meds ordered this encounter  Medications   amLODipine (NORVASC) 2.5 MG tablet    Sig: Take 1 tablet (2.5 mg total) by mouth daily.    Dispense:  90 tablet    Refill:  1   simvastatin (ZOCOR) 20 MG tablet    Sig: Take 1 tablet (20 mg total) by mouth daily at 6 PM.    Dispense:  90 tablet    Refill:  3    No follow-ups on file.   Total time spent:35 Minutes Time spent includes review of chart, medications, test results, and follow up plan with the patient.   [place] Controlled Substance Database was reviewed by me.  This patient was seen by Lynn Ito, PA-C in collaboration with Dr. Beverely Risen as a part of collaborative care agreement.  Lynn Ito, PA-C Internal medicine

## 2023-02-14 ENCOUNTER — Other Ambulatory Visit: Payer: Self-pay | Admitting: Physician Assistant

## 2023-02-14 DIAGNOSIS — Z0000 Encounter for general adult medical examination without abnormal findings: Secondary | ICD-10-CM | POA: Diagnosis not present

## 2023-02-14 DIAGNOSIS — E559 Vitamin D deficiency, unspecified: Secondary | ICD-10-CM | POA: Diagnosis not present

## 2023-02-14 DIAGNOSIS — E038 Other specified hypothyroidism: Secondary | ICD-10-CM | POA: Diagnosis not present

## 2023-02-14 DIAGNOSIS — R5383 Other fatigue: Secondary | ICD-10-CM | POA: Diagnosis not present

## 2023-02-14 DIAGNOSIS — E538 Deficiency of other specified B group vitamins: Secondary | ICD-10-CM | POA: Diagnosis not present

## 2023-02-14 DIAGNOSIS — R6889 Other general symptoms and signs: Secondary | ICD-10-CM | POA: Diagnosis not present

## 2023-02-15 ENCOUNTER — Other Ambulatory Visit: Payer: Self-pay | Admitting: Physician Assistant

## 2023-02-15 DIAGNOSIS — I10 Essential (primary) hypertension: Secondary | ICD-10-CM

## 2023-02-17 LAB — UA/M W/RFLX CULTURE, ROUTINE
Bilirubin, UA: NEGATIVE
Glucose, UA: NEGATIVE
Ketones, UA: NEGATIVE
Nitrite, UA: NEGATIVE
Protein,UA: NEGATIVE
RBC, UA: NEGATIVE
Specific Gravity, UA: 1.017 (ref 1.005–1.030)
Urobilinogen, Ur: 1 mg/dL (ref 0.2–1.0)
pH, UA: 6.5 (ref 5.0–7.5)

## 2023-02-17 LAB — BASIC METABOLIC PANEL
BUN/Creatinine Ratio: 13 (ref 12–28)
BUN: 14 mg/dL (ref 8–27)
CO2: 25 mmol/L (ref 20–29)
Calcium: 9.5 mg/dL (ref 8.7–10.3)
Chloride: 99 mmol/L (ref 96–106)
Creatinine, Ser: 1.05 mg/dL — ABNORMAL HIGH (ref 0.57–1.00)
Glucose: 91 mg/dL (ref 70–99)
Potassium: 4 mmol/L (ref 3.5–5.2)
Sodium: 141 mmol/L (ref 134–144)
eGFR: 53 mL/min/{1.73_m2} — ABNORMAL LOW (ref >59)

## 2023-02-17 LAB — LIPID PANEL WITH LDL/HDL RATIO
Cholesterol, Total: 204 mg/dL — ABNORMAL HIGH (ref 100–199)
HDL: 62 mg/dL (ref >39)
LDL Chol Calc (NIH): 123 mg/dL — ABNORMAL HIGH (ref 0–99)
LDL/HDL Ratio: 2 {ratio} (ref 0.0–3.2)
Triglycerides: 109 mg/dL (ref 0–149)
VLDL Cholesterol Cal: 19 mg/dL (ref 5–40)

## 2023-02-17 LAB — CBC WITH DIFFERENTIAL/PLATELET
Basophils Absolute: 0 10*3/uL (ref 0.0–0.2)
Basos: 1 %
EOS (ABSOLUTE): 0.1 10*3/uL (ref 0.0–0.4)
Eos: 3 %
Hematocrit: 42.3 % (ref 34.0–46.6)
Hemoglobin: 13.7 g/dL (ref 11.1–15.9)
Immature Grans (Abs): 0 10*3/uL (ref 0.0–0.1)
Immature Granulocytes: 0 %
Lymphocytes Absolute: 1.3 10*3/uL (ref 0.7–3.1)
Lymphs: 30 %
MCH: 30 pg (ref 26.6–33.0)
MCHC: 32.4 g/dL (ref 31.5–35.7)
MCV: 93 fL (ref 79–97)
Monocytes Absolute: 0.3 10*3/uL (ref 0.1–0.9)
Monocytes: 8 %
Neutrophils Absolute: 2.6 10*3/uL (ref 1.4–7.0)
Neutrophils: 58 %
Platelets: 139 10*3/uL — ABNORMAL LOW (ref 150–450)
RBC: 4.56 x10E6/uL (ref 3.77–5.28)
RDW: 12.5 % (ref 11.7–15.4)
WBC: 4.3 10*3/uL (ref 3.4–10.8)

## 2023-02-17 LAB — URINE CULTURE, REFLEX

## 2023-02-17 LAB — B12 AND FOLATE PANEL
Folate: 10.2 ng/mL (ref >3.0)
Vitamin B-12: 297 pg/mL (ref 232–1245)

## 2023-02-17 LAB — MICROSCOPIC EXAMINATION
Bacteria, UA: NONE SEEN
Casts: NONE SEEN /[LPF]
RBC, Urine: NONE SEEN /[HPF] (ref 0–2)

## 2023-02-17 LAB — TSH: TSH: 3.5 u[IU]/mL (ref 0.450–4.500)

## 2023-02-17 LAB — VITAMIN D 25 HYDROXY (VIT D DEFICIENCY, FRACTURES): Vit D, 25-Hydroxy: 31.9 ng/mL (ref 30.0–100.0)

## 2023-02-17 LAB — T4: T4, Total: 7.3 ug/dL (ref 4.5–12.0)

## 2023-02-25 ENCOUNTER — Telehealth: Payer: Self-pay

## 2023-02-25 NOTE — Telephone Encounter (Signed)
Tried to call patient regarding labs, mailbox full and MyChart not active.

## 2023-02-25 NOTE — Telephone Encounter (Signed)
-----   Message from Carlean Jews sent at 02/23/2023  1:14 PM EDT ----- Please let her know her kidney function was just slightly reduced and did have some evidence of UTI, but culture showed little growth that typically does not require any treatment. Additionally cholesterol slightly improved but should continue to work on this. Platelets a little low which she has had previously and will continue to monitor. B12 low and may do shots. Vit low normal and should supplement OTC.

## 2023-04-13 ENCOUNTER — Telehealth: Payer: Self-pay

## 2023-04-13 NOTE — Telephone Encounter (Signed)
[facility] Med Adherence Report: Spoke with patient about their Losartan-HCT 100-25 tab. Patient is taking prescribed amount as directed. Stressed the importance of medication adherence to the patient. Patient confirmed that they have enough and has just picked up their refill.

## 2023-08-08 ENCOUNTER — Ambulatory Visit: Payer: PPO | Admitting: Physician Assistant

## 2023-08-19 ENCOUNTER — Other Ambulatory Visit: Payer: Self-pay | Admitting: Physician Assistant

## 2023-08-19 DIAGNOSIS — I10 Essential (primary) hypertension: Secondary | ICD-10-CM

## 2023-10-04 ENCOUNTER — Telehealth: Payer: Self-pay

## 2023-10-04 NOTE — Progress Notes (Addendum)
   10/04/2023  Patient ID: Rachael Knapp, female   DOB: 09-06-1940, 83 y.o.   MRN: [id number]  This patient is appearing on a report for being at risk of failing the adherence measure for identified medications this calendar year.   Medication Adherence Summary (STAR/HEDIS Monitoring): Adherence Category: hypertension (ACEi/ARB)    Drug Name: Losartan / hydrochlorothiazide 100-25 mg  Last Fill or Sold Date:12/01/2022 Days' Supply: 30     Notes: ? Adherence data pulled from pharmacy claims portal Dr. Anson Basta (under the name Lorelee Roger). -   Losartan / hydrochlorothiazide last picked up 09/20/2023 for 30 days supply, per [facility] staff. They also stated patient last filled simvastatin  October 2024. Per chart review, it was noted sometimes patient forgets to take medication. Will call patient when losartan /hydrochlorothiazide is due for refill.  ? Reviewed barriers to adherence: forgetfulness of simvastatin  (medication not on report) ? Plan: Follow up next month and discuss adherence of simvastatin .   Alexandria Angel, PharmD Clinical Pharmacist Cell: [phone]

## 2023-10-17 ENCOUNTER — Telehealth: Payer: Self-pay

## 2023-10-17 NOTE — Progress Notes (Signed)
   10/17/2023  Patient ID: Rachael Knapp, female   DOB: 20-Oct-1940, 84 y.o.   MRN: [id number]  This patient is appearing on a report for being at risk of failing the adherence measure for identified medications this calendar year.   Medication Adherence Summary (STAR/HEDIS Monitoring): Adherence Category: hypertension (ACEi/ARB)    Drug Name:  Losartan / hydrochlorothiazide 100-25 mg  Last Fill or Sold Date:09/20/2023 (per [facility])       Notes: ? Adherence data pulled from pharmacy claims portal Dr. Anson Basta, but information is outdated.  ?I called [facility] and was informed simvastatin  is ready for pick up now and patient's  Losartan / hydrochlorothiazide 100-25 mg is due in about 4 days. I called patient but was unable to leave a message.   Alexandria Angel, PharmD Clinical Pharmacist Cell: [phone]

## 2023-10-28 ENCOUNTER — Other Ambulatory Visit: Payer: Self-pay | Admitting: Physician Assistant

## 2023-10-28 DIAGNOSIS — I10 Essential (primary) hypertension: Secondary | ICD-10-CM

## 2023-11-02 NOTE — Progress Notes (Signed)
   11/02/2023  Patient ID: Rachael Knapp, female   DOB: 04-19-1941, 83 y.o.   MRN: [id number]  This patient is appearing on a report for being at risk of failing the adherence measure for identified medications this calendar year.   Medication Adherence Summary (STAR/HEDIS Monitoring): Adherence Category: hypertension (ACEi/ARB)    Drug Name: Losartan / hydrochlorothiazide 100-25 mg  Sold Date:10/28/2023 Days' Supply: 30    Notes: ? Adherence information was retrieved by calling [facility] ? Patient education provided on importance of adherence for chronic condition management and STAR quality performance. ? Plan: Follow for next fill at the end of this month  Dorcas Solian, PharmD Clinical Pharmacist Cell: [phone]

## 2023-11-21 ENCOUNTER — Telehealth: Payer: Self-pay

## 2023-11-21 NOTE — Progress Notes (Signed)
   11/21/2023  Patient ID: Rachael Knapp, female   DOB: [birth year], 83 y.o.   MRN: [id number]  This patient is appearing on a report for being at risk of failing the adherence measure for identified medications this calendar year.   Medication Adherence Summary (STAR/HEDIS Monitoring): Adherence Category: hypertension (ACEi/ARB)    Drug Name: Losartan / hydrochlorothiazide 100-25 mg  Sold Date:10/28/2023 Days' Supply: 30  Simvastatin  is not on the report yet, but it has not been picked up  since 02/07/2023 for 90 days supply, per [facility]. Prior documentation indicates patient sometimes forget to take medication. Will follow compliance. I called the patient today, but was unable to reach. I could not leave a voice mail as it was not available.      Notes: ? Adherence information was retrieved by calling [facility]  ? Plan: Follow for next fill at the end of this month  Dorcas Solian, PharmD Clinical Pharmacist Cell: [phone]

## 2023-11-28 ENCOUNTER — Telehealth: Payer: Self-pay

## 2023-11-28 NOTE — Progress Notes (Signed)
   11/28/2023  Patient ID: Almarie Jenkins Boxer, female   DOB: 05/09/1940, 83 y.o.   MRN: [id number]  This patient is appearing on a report for being at risk of failing the adherence measure for identified medications this calendar year.   Medication Adherence Summary (STAR/HEDIS Monitoring): Adherence Category: hypertension (ACEi/ARB)    Drug Name: Losartan / hydrochlorothiazide 100-25 mg  Sold Date:10/28/2023 (to date as of 11/28/2023) Days' Supply: 30  Simvastatin  is not on the report yet, but it has not been picked up  since 02/07/2023 for 90 days supply, per [facility]. Prior documentation indicates patient sometimes forget to take medication. Will follow compliance. I called the patient today, but was unable to reach. I could not leave a voice mail as it was not available.      Notes: ? Adherence information was retrieved by calling [facility] - I called and was unable to leave a message as voice mail full.  ? Plan: Follow for next fill at the end of this month  Dorcas Solian, PharmD Clinical Pharmacist Cell: [phone]

## 2023-12-05 ENCOUNTER — Other Ambulatory Visit: Payer: Self-pay

## 2023-12-05 ENCOUNTER — Telehealth: Payer: Self-pay

## 2023-12-05 DIAGNOSIS — I10 Essential (primary) hypertension: Secondary | ICD-10-CM

## 2023-12-05 MED ORDER — LOSARTAN POTASSIUM-HCTZ 100-25 MG PO TABS: 1.0000 | ORAL_TABLET | Freq: Every day | ORAL | 2 refills | Status: DC

## 2023-12-05 NOTE — Telephone Encounter (Signed)
 Patient came into the office requesting Losartan  refill since she is out and not feeling well because so. Told patient I will refill it but that she must attend her follow-up in October in order to maintain refills. Patient agreeable.

## 2023-12-05 NOTE — Progress Notes (Signed)
   12/05/2023  Patient ID: Rachael Knapp, female   DOB: 1941/02/05, 83 y.o.   MRN: [id number]  This patient is appearing on a report for being at risk of failing the adherence measure for identified medications this calendar year.   Medication Adherence Summary (STAR/HEDIS Monitoring): Adherence Category: hypertension (ACEi/ARB)    Drug Name: Losartan / hydrochlorothiazide 100-25 mg  Sold Date:10/28/2023  Days' Supply: 30      Notes: ? Adherence information not available in Dr. Annemarie. I called patient but was unable to reach and could leave a voice message.   ? Plan: Follow for next fill   Dorcas Solian, PharmD Clinical Pharmacist Cell: [phone]

## 2023-12-12 ENCOUNTER — Telehealth: Payer: Self-pay

## 2023-12-12 NOTE — Progress Notes (Signed)
   12/12/2023  Patient ID: Rachael Knapp, female   DOB: 03/09/1941, 83 y.o.   MRN: [id number]  This patient is appearing on a report for being at risk of failing the adherence measure for identified medications this calendar year.   Medication Adherence Summary (STAR/HEDIS Monitoring): Adherence Category: hypertension (ACEi/ARB)    Drug Name: Losartan / hydrochlorothiazide 100-25 mg  Sold Date: 12/05/2023  Days' Supply: 30 (2 refills remaining)      Notes: ? Adherence information not available in Dr. Annemarie. I called patient but was unable to reach and could leave a voice message. I called [facility] for the above information   ? Plan: Follow for next fill and will reach out to PCP about 90 or 100 days supply   Dorcas Solian, PharmD Clinical Pharmacist Cell: [phone]

## 2024-01-03 ENCOUNTER — Telehealth: Payer: Self-pay

## 2024-01-03 NOTE — Progress Notes (Signed)
   01/03/2024  Patient ID: Rachael Knapp, female   DOB: April 29, 1941, 83 y.o.   MRN: [id number]  This patient is appearing on a report for being at risk of failing the adherence measure for identified medications this calendar year.   Medication Adherence Summary (STAR/HEDIS Monitoring): Adherence Category: hypertension (ACEi/ARB)    Drug Name: Losartan / hydrochlorothiazide 100-25 mg  Sold Date: 12/29/2023  Days' Supply: 30 (1 refill remaining)      Notes: ? Adherence information not available in Dr. Annemarie. I called patient but was unable to reach and could leave a voice message; unable to discuss simvastatin  compliance. I called [facility] for the above information   ? Plan: Follow for next fill.  Dorcas Solian, PharmD Clinical Pharmacist Cell: [phone]

## 2024-01-31 ENCOUNTER — Telehealth: Payer: Self-pay

## 2024-01-31 NOTE — Progress Notes (Signed)
   01/31/2024  Patient ID: Rachael Knapp, female   DOB: [birth year], 83 y.o.   MRN: [id number]  Pharmacy Quality Measure Review  This patient is appearing on a report for being at risk of failing the adherence measure for hypertension (ACEi/ARB) medications this calendar year.   Medication: Losartan -HCTZ Last fill date: 12/2823 for 30 day supply  Attempted to contact patient. Unable to leave voicemail.  [name], PharmD Clinical Pharmacist [phone]

## 2024-02-13 ENCOUNTER — Ambulatory Visit (INDEPENDENT_AMBULATORY_CARE_PROVIDER_SITE_OTHER): Payer: PPO | Admitting: Physician Assistant

## 2024-02-13 ENCOUNTER — Encounter: Payer: Self-pay | Admitting: Physician Assistant

## 2024-02-13 VITALS — BP 128/80 | HR 67 | Temp 97.8°F | Resp 16 | Ht 67.0 in | Wt 160.4 lb

## 2024-02-13 DIAGNOSIS — E782 Mixed hyperlipidemia: Secondary | ICD-10-CM | POA: Diagnosis not present

## 2024-02-13 DIAGNOSIS — K219 Gastro-esophageal reflux disease without esophagitis: Secondary | ICD-10-CM

## 2024-02-13 DIAGNOSIS — Z23 Encounter for immunization: Secondary | ICD-10-CM

## 2024-02-13 DIAGNOSIS — Z0000 Encounter for general adult medical examination without abnormal findings: Secondary | ICD-10-CM

## 2024-02-13 DIAGNOSIS — I10 Essential (primary) hypertension: Secondary | ICD-10-CM

## 2024-02-13 MED ORDER — LANSOPRAZOLE 30 MG PO CPDR: 30.0000 mg | DELAYED_RELEASE_CAPSULE | Freq: Every day | ORAL | 2 refills | Status: AC

## 2024-02-13 MED ORDER — SIMVASTATIN 20 MG PO TABS: 20.0000 mg | ORAL_TABLET | Freq: Every day | ORAL | 3 refills | Status: AC

## 2024-02-13 MED ORDER — LOSARTAN POTASSIUM-HCTZ 100-25 MG PO TABS: 1.0000 | ORAL_TABLET | Freq: Every day | ORAL | 3 refills | Status: AC

## 2024-02-13 NOTE — Progress Notes (Signed)
 [facility] [address] [place], [place] [address]  Internal MEDICINE  Office Visit Note  Patient Name: Rachael Knapp  [id number]  [id number]  Date of Service: 02/13/2024  Chief Complaint  Patient presents with   Medicare Wellness    HPI Rachael Knapp presents for an annual well visit and physical exam.  Well-appearing 83 y.o.female Other concerns: -Still working M-F 8-1:30, feels well getting out. Knows memory has gone down some -Only taking her losartan -hydrochlorothiazide and BP stable. Will d/c amlodipine  and monitor -having some reflux at night, has not been taking her acid reflex med in a long time. Will refill now -knees bothersome, but still able to walk independently      02/13/2024   10:51 AM 02/07/2023   10:50 AM 01/21/2022    2:03 PM  MMSE - Mini Mental State Exam  Orientation to time 4 5 5   Orientation to Place 5 5 5   Registration 3 3 3   Attention/ Calculation 5 5 5   Recall 3 3 3   Language- name 2 objects 2 2 2   Language- repeat 1 1 1   Language- follow 3 step command 3 3 3   Language- read & follow direction 0 1 1  Write a sentence 0 1 1  Copy design 1 1 1   Total score 27 30 30     Functional Status Survey: Is the patient deaf or have difficulty hearing?: Yes Does the patient have difficulty seeing, even when wearing glasses/contacts?: No Does the patient have difficulty concentrating, remembering, or making decisions?: Yes Does the patient have difficulty walking or climbing stairs?: Yes Does the patient have difficulty dressing or bathing?: No Does the patient have difficulty doing errands alone such as visiting a doctor's office or shopping?: No     05/07/2021    2:20 PM 01/21/2022    2:01 PM 02/18/2022    2:22 PM 02/07/2023   10:49 AM 02/13/2024   10:56 AM  Fall Risk  Falls in the past year? 0 0 0 0 0  (RETIRED) Patient Fall Risk Level  Low fall risk      Patient at Risk for Falls Due to  No Fall Risks   No Fall Risks  Fall  risk Follow up  Falls evaluation completed    Falls evaluation completed     Data saved with a previous flowsheet row definition       02/13/2024   10:57 AM  Depression screen PHQ 2/9  Decreased Interest 0  Down, Depressed, Hopeless 0  PHQ - 2 Score 0        No data to display            Current Medication: Outpatient Encounter Medications as of 02/13/2024  Medication Sig   diclofenac  Sodium (VOLTAREN ) 1 % GEL Apply 4 g topically 4 (four) times daily.   [DISCONTINUED] amLODipine  (NORVASC ) 2.5 MG tablet TAKE ONE TABLET BY MOUTH EVERY DAY   [DISCONTINUED] lansoprazole  (PREVACID ) 30 MG capsule Take 1 capsule (30 mg total) by mouth daily at 12 noon.   [DISCONTINUED] losartan -hydrochlorothiazide (HYZAAR) 100-25 MG tablet Take 1 tablet by mouth daily.   [DISCONTINUED] simvastatin  (ZOCOR ) 20 MG tablet Take 1 tablet (20 mg total) by mouth daily at 6 PM.   lansoprazole  (PREVACID ) 30 MG capsule Take 1 capsule (30 mg total) by mouth daily at 12 noon.   losartan -hydrochlorothiazide (HYZAAR) 100-25 MG tablet Take 1 tablet by mouth daily.   simvastatin  (ZOCOR ) 20 MG tablet Take 1 tablet (20 mg total) by mouth  daily at 6 PM.   [DISCONTINUED] CHLORPHENIRAMINE-PHENYLEPHRINE PO Take by mouth. (Patient not taking: Reported on 02/13/2024)   No facility-administered encounter medications on file as of 02/13/2024.    Surgical History: Past Surgical History:  Procedure Laterality Date   ABDOMINAL HYSTERECTOMY     APPENDECTOMY     CHOLECYSTECTOMY     COLONOSCOPY WITH PROPOFOL  N/A 05/15/2018   Procedure: COLONOSCOPY WITH PROPOFOL ;  Surgeon: Gaylyn Gladis PENNER, MD;  Location: [facility] ENDOSCOPY;  Service: Endoscopy;  Laterality: N/A;   HERNIA REPAIR     KNEE ARTHROSCOPY      Medical History: Past Medical History:  Diagnosis Date   GERD (gastroesophageal reflux disease)    Hyperlipemia    Hypertension     Family History: Family History  Problem Relation Age of Onset   Cancer Father     Hypertension Sister    Cancer Sister    Hypertension Brother    Cancer Brother    Cancer Brother    Diabetes Sister    Parkinson's disease Brother    Breast cancer Neg Hx     Social History   Socioeconomic History   Marital status: Widowed    Spouse name: Not on file   Number of children: Not on file   Years of education: Not on file   Highest education level: Not on file  Occupational History   Not on file  Tobacco Use   Smoking status: Never   Smokeless tobacco: Never  Vaping Use   Vaping status: Never Used  Substance and Sexual Activity   Alcohol use: No   Drug use: Never   Sexual activity: Not on file  Other Topics Concern   Not on file  Social History Narrative   Not on file   Social Drivers of Health   Financial Resource Strain: Not on file  Food Insecurity: Not on file  Transportation Needs: Not on file  Physical Activity: Not on file  Stress: Not on file  Social Connections: Not on file  Intimate Partner Violence: Not on file      Review of Systems  Constitutional:  Negative for chills, fatigue and unexpected weight change.  HENT:  Negative for congestion, postnasal drip, rhinorrhea, sneezing and sore throat.   Eyes:  Negative for redness.  Respiratory:  Negative for cough, chest tightness and shortness of breath.   Cardiovascular:  Negative for chest pain and palpitations.  Gastrointestinal:  Negative for abdominal pain, constipation, diarrhea, nausea and vomiting.  Genitourinary:  Negative for dysuria and frequency.  Musculoskeletal:  Positive for arthralgias. Negative for back pain, joint swelling and neck pain.       Knee pain  Skin:  Negative for rash.  Neurological: Negative.  Negative for tremors and numbness.  Hematological:  Negative for adenopathy. Does not bruise/bleed easily.  Psychiatric/Behavioral:  Negative for behavioral problems (Depression), sleep disturbance and suicidal ideas. The patient is not nervous/anxious.     Vital  Signs: BP 128/80   Pulse 67   Temp 97.8 F (36.6 C)   Resp 16   Ht 5' 7 (1.702 m)   Wt 160 lb 6.4 oz (72.8 kg)   SpO2 99%   BMI 25.12 kg/m    Physical Exam Vitals and nursing note reviewed.  Constitutional:      General: She is not in acute distress.    Appearance: Normal appearance. She is well-developed. She is not diaphoretic.  HENT:     Head: Normocephalic and atraumatic.  Eyes:  Extraocular Movements: Extraocular movements intact.  Neck:     Thyroid : No thyromegaly.     Vascular: No JVD.     Trachea: No tracheal deviation.  Cardiovascular:     Rate and Rhythm: Normal rate and regular rhythm.     Pulses: Normal pulses.     Heart sounds:     No friction rub.  Pulmonary:     Effort: Pulmonary effort is normal. No respiratory distress.     Breath sounds: Normal breath sounds. No wheezing or rales.  Chest:     Chest wall: No tenderness.  Skin:    General: Skin is warm and dry.  Neurological:     General: No focal deficit present.     Mental Status: She is alert and oriented to person, place, and time.     Cranial Nerves: No cranial nerve deficit.  Psychiatric:        Mood and Affect: Mood normal.        Behavior: Behavior normal.        Thought Content: Thought content normal.        Judgment: Judgment normal.        Assessment/Plan: 1. Encounter for annual wellness exam in Medicare patient (Primary) AWV performed  2. Essential hypertension BP stable off of amlodipine  and will d/c and continue on hyzaar alone and monitor - losartan -hydrochlorothiazide (HYZAAR) 100-25 MG tablet; Take 1 tablet by mouth daily.  Dispense: 90 tablet; Refill: 3  3. Mixed hyperlipidemia Restart simvastatin  - simvastatin  (ZOCOR ) 20 MG tablet; Take 1 tablet (20 mg total) by mouth daily at 6 PM.  Dispense: 90 tablet; Refill: 3  4. Gastroesophageal reflux disease without esophagitis Restart on prevacid  - lansoprazole  (PREVACID ) 30 MG capsule; Take 1 capsule (30 mg total) by  mouth daily at 12 noon.  Dispense: 30 capsule; Refill: 2  5. Flu vaccine need - Influenza, MDCK, trivalent, PF(Flucelvax egg-free)     General Counseling: Almarie oakland understanding of the findings of todays visit and agrees with plan of treatment. I have discussed any further diagnostic evaluation that may be needed or ordered today. We also reviewed her medications today. she has been encouraged to call the office with any questions or concerns that should arise related to todays visit.    Orders Placed This Encounter  Procedures   Influenza, MDCK, trivalent, PF(Flucelvax egg-free)    Meds ordered this encounter  Medications   simvastatin  (ZOCOR ) 20 MG tablet    Sig: Take 1 tablet (20 mg total) by mouth daily at 6 PM.    Dispense:  90 tablet    Refill:  3   losartan -hydrochlorothiazide (HYZAAR) 100-25 MG tablet    Sig: Take 1 tablet by mouth daily.    Dispense:  90 tablet    Refill:  3    Patient needs to be sure she comes to follow-up appt with PCP for further refills.   lansoprazole  (PREVACID ) 30 MG capsule    Sig: Take 1 capsule (30 mg total) by mouth daily at 12 noon.    Dispense:  30 capsule    Refill:  2    Return in about 6 months (around 08/13/2024) for HTN.   Total time spent:35 Minutes Time spent includes review of chart, medications, test results, and follow up plan with the patient.   [place] Controlled Substance Database was reviewed by me.  This patient was seen by Tinnie Pro, PA-C in collaboration with Dr. Sigrid Bathe as a part of collaborative care agreement.  Tinnie Pro, PA-C Internal medicine

## 2024-03-09 NOTE — Progress Notes (Signed)
   03/09/2024  Patient ID: Rachael Knapp, female   DOB: November 23, 1940, 83 y.o.   MRN: [id number]  Pharmacy Quality Measure Review  This patient is appearing on a report for being at risk of failing the adherence measure for hypertension (ACEi/ARB) medications this calendar year.   Medication: Losartan -HCTZ Last fill date: 02/13/24 for 90 day supply  Insurance report not up to date. No action needed at this time.  [name], PharmD Clinical Pharmacist [phone]

## 2024-03-19 ENCOUNTER — Telehealth: Payer: Self-pay | Admitting: Internal Medicine

## 2024-03-19 NOTE — Telephone Encounter (Addendum)
 Received chronic condition verification from [facility]. Gave to DFK-Toni  Signed. Faxed back to [facility]; [phone]. Scanned-Toni

## 2024-08-13 ENCOUNTER — Ambulatory Visit: Admitting: Physician Assistant

## 2025-02-14 ENCOUNTER — Ambulatory Visit: Admitting: Physician Assistant
